# Patient Record
Sex: Male | Born: 1970
Health system: Southern US, Community
[De-identification: ages and names within clinical notes are randomized; demographics above are authoritative.]

## PROBLEM LIST (undated history)

## (undated) DIAGNOSIS — M7581 Other shoulder lesions, right shoulder: Secondary | ICD-10-CM

## (undated) DIAGNOSIS — M5412 Radiculopathy, cervical region: Secondary | ICD-10-CM

## (undated) DIAGNOSIS — N200 Calculus of kidney: Secondary | ICD-10-CM

## (undated) DIAGNOSIS — E669 Obesity, unspecified: Secondary | ICD-10-CM

## (undated) DIAGNOSIS — I700 Atherosclerosis of aorta: Secondary | ICD-10-CM

## (undated) DIAGNOSIS — N209 Urinary calculus, unspecified: Secondary | ICD-10-CM

## (undated) DIAGNOSIS — K4020 Bilateral inguinal hernia, without obstruction or gangrene, not specified as recurrent: Secondary | ICD-10-CM

## (undated) DIAGNOSIS — G43909 Migraine, unspecified, not intractable, without status migrainosus: Secondary | ICD-10-CM

## (undated) DIAGNOSIS — J343 Hypertrophy of nasal turbinates: Secondary | ICD-10-CM

## (undated) DIAGNOSIS — K219 Gastro-esophageal reflux disease without esophagitis: Secondary | ICD-10-CM

## (undated) DIAGNOSIS — E538 Deficiency of other specified B group vitamins: Secondary | ICD-10-CM

## (undated) DIAGNOSIS — N529 Male erectile dysfunction, unspecified: Secondary | ICD-10-CM

## (undated) DIAGNOSIS — K429 Umbilical hernia without obstruction or gangrene: Secondary | ICD-10-CM

## (undated) DIAGNOSIS — E559 Vitamin D deficiency, unspecified: Secondary | ICD-10-CM

## (undated) DIAGNOSIS — D120 Benign neoplasm of cecum: Secondary | ICD-10-CM

## (undated) HISTORY — DX: Calculus of kidney: N20.0

## (undated) HISTORY — DX: Gastro-esophageal reflux disease without esophagitis: K21.9

## (undated) HISTORY — PX: CERVICAL DISC ARTHROPLASTY: SHX587

## (undated) HISTORY — PX: URETEROSCOPY: SHX842

---

## 2010-05-24 HISTORY — PX: OTHER SURGICAL HISTORY: SHX169

## 2012-05-24 HISTORY — PX: NASAL SEPTUM SURGERY: SHX37

## 2014-05-24 HISTORY — PX: NASAL SINUS SURGERY: SHX719

## 2015-05-25 HISTORY — PX: LITHOTRIPSY: SUR834

## 2016-11-17 ENCOUNTER — Ambulatory Visit (INDEPENDENT_AMBULATORY_CARE_PROVIDER_SITE_OTHER): Payer: 59 | Admitting: Internal Medicine

## 2016-11-17 ENCOUNTER — Encounter: Payer: Self-pay | Admitting: Internal Medicine

## 2016-11-17 VITALS — BP 122/82 | HR 69 | Temp 98.2°F | Resp 15 | Ht 72.5 in | Wt 251.6 lb

## 2016-11-17 DIAGNOSIS — R059 Cough, unspecified: Secondary | ICD-10-CM

## 2016-11-17 DIAGNOSIS — M25511 Pain in right shoulder: Secondary | ICD-10-CM

## 2016-11-17 DIAGNOSIS — J329 Chronic sinusitis, unspecified: Secondary | ICD-10-CM

## 2016-11-17 DIAGNOSIS — E669 Obesity, unspecified: Secondary | ICD-10-CM

## 2016-11-17 DIAGNOSIS — R05 Cough: Secondary | ICD-10-CM | POA: Diagnosis not present

## 2016-11-17 DIAGNOSIS — Z79899 Other long term (current) drug therapy: Secondary | ICD-10-CM

## 2016-11-17 DIAGNOSIS — K2100 Gastro-esophageal reflux disease with esophagitis, without bleeding: Secondary | ICD-10-CM

## 2016-11-17 DIAGNOSIS — R0981 Nasal congestion: Secondary | ICD-10-CM

## 2016-11-17 DIAGNOSIS — G8929 Other chronic pain: Secondary | ICD-10-CM | POA: Diagnosis not present

## 2016-11-17 DIAGNOSIS — E538 Deficiency of other specified B group vitamins: Secondary | ICD-10-CM | POA: Diagnosis not present

## 2016-11-17 DIAGNOSIS — E559 Vitamin D deficiency, unspecified: Secondary | ICD-10-CM

## 2016-11-17 DIAGNOSIS — R5383 Other fatigue: Secondary | ICD-10-CM | POA: Diagnosis not present

## 2016-11-17 MED ORDER — MONTELUKAST SODIUM 10 MG PO TABS
10.0000 mg | ORAL_TABLET | Freq: Every day | ORAL | 3 refills | Status: DC
Start: 2016-11-17 — End: 2017-03-11

## 2016-11-17 NOTE — Patient Instructions (Addendum)
Try using fish oil and turmeric for your joint pains.    I have made  a referral to [facility] to resolve your sinus issues, and I have ordered Pulmonary Function Tests to be done in [place]   I appreciate your concern about continuing your prilosec (a PPI) long term  in light of the recently published studies suggesting an association with increased risk of dementia and kidney failure. The theory is that they block absorption of important vitamins and minerals and cause deficiencies that lead to other health problems.    I advise you to try switching  From your PPI to either famotidine 20 mg once or twice daily,  or to  ranitidine 150 mg once or twice daily.  These medications are  H2 blockers and are available without a prescription.    if your reflux symptoms are controlled,  You can Continue the daily h2 blocker until they are ot and then resume the Prilosec until symptoms are resolved.  Marland Kitchen

## 2016-11-17 NOTE — Progress Notes (Signed)
Subjective:  Patient ID: Seth Stone, male    DOB: July 25, 1970  Age: 46 y.o. MRN: [id number]  CC: The primary encounter diagnosis was Sinus congestion. Diagnoses of Obesity (BMI 30.0-34.9), Fatigue, unspecified type, Cough, Long-term use of high-risk medication, Vitamin D deficiency, B12 deficiency, Chronic right shoulder pain, Chronic congestion of paranasal sinus, and Gastroesophageal reflux disease with esophagitis were also pertinent to this visit.  HPI Seth Stone presents for establishment of care, referred by Seth and Seth Stone.    Cc;  Rightshoulder and right elbow pain , not severe,  Has been working out with Architectural technologist.    2) sinus congestion.  Had Sinus surgery in 2016 at [facility]  Symptoms resolved transiently but returned .  Had allergy testing , but now thinks certain foods trigger it including beer . Had a lot of allergy symptom this spring , tried zyrtec and flonase. flonase helped more . Has had some nosebleeds.  3) Cough/pulmonary :  Has OSA  By one study ,  Second study was negative but done right after sinus surgery, before he stated snoring again.  Owns CPAP but does not use it currently.   Doesn't tolerate it,  woke up gasping every night  Sleeps on side  Currently has restorative sleep,  No daytime somnolence and normal blood pressure.     Has had PFTs ,wants to have them REPEATED given his chronic cough    Diagnosed  With GERD and hiatal hernia   Has been taking daily prilosec for 13 years.   History of Kidney stones    History of Low Vitamin D   Lunch at 1:00 today burger without bun .  History Seth has a past medical history of GERD (gastroesophageal reflux disease).   He has a past surgical history that includes hemorrhiordectomy (2012); Lithotripsy (2017); Nasal septum surgery (2014); and Nasal sinus surgery (2016).   His family history includes Alcohol abuse in his father; Arthritis in his father; Colon cancer in his maternal grandmother;  Diabetes in his mother; Emphysema in his sister; Hypertension in his father; Stroke in his maternal grandmother.He reports that he has never smoked. He has never used smokeless tobacco. He reports that he drinks alcohol. He reports that he does not use drugs.  No outpatient prescriptions prior to visit.   No facility-administered medications prior to visit.     Review of Systems:  Patient denies headache, fevers, malaise, unintentional weight loss, skin rash, eye pain, sinus congestion and sinus pain, sore throat, dysphagia,  hemoptysis , cough, dyspnea, wheezing, chest pain, palpitations, orthopnea, edema, abdominal pain, nausea, melena, diarrhea, constipation, flank pain, dysuria, hematuria, urinary  Frequency, nocturia, numbness, tingling, seizures,  Focal weakness, Loss of consciousness,  Tremor, insomnia, depression, anxiety, and suicidal ideation.     Objective:   BP 122/82 (BP Location: Left Arm, Patient Position: Sitting, Cuff Size: Large)   Pulse 69   Temp 98.2 F (36.8 C) (Oral)   Resp 15   Ht 6' 0.5" (1.842 m)   Wt 251 lb 9.6 oz (114.1 kg)   SpO2 96%   BMI 33.65 kg/m   Physical Exam:  General appearance: alert, cooperative and appears stated age Ears: normal TM's and external ear canals both ears Throat: lips, mucosa, and tongue normal; teeth and gums normal Neck: no adenopathy, no carotid bruit, supple, symmetrical, trachea midline and thyroid not enlarged, symmetric, no tenderness/mass/nodules Back: symmetric, no curvature. ROM normal. No CVA tenderness. Lungs: clear to auscultation bilaterally Heart: regular rate  and rhythm, S1, S2 normal, no murmur, click, rub or gallop Abdomen: soft, non-tender; bowel sounds normal; no masses,  no organomegaly Pulses: 2+ and symmetric Skin: Skin color, texture, turgor normal. No rashes or lesions Lymph nodes: Cervical, supraclavicular, and axillary nodes normal.   Assessment & Plan:   Problem List Items Addressed This Visit      Vitamin D deficiency   Relevant Orders   VITAMIN D 25 Hydroxy (Vit-D Deficiency, Fractures) (Completed)   Obesity (BMI 30.0-34.9)    I have addressed  BMI and recommended wt loss of 10% of body weigh over the next 6 months using a low glycemic index diet and regular exercise a minimum of 5 days per week.        Relevant Orders   Lipid panel (Completed)   Gastroesophageal reflux disease with esophagitis    Has beenon chronci acid suppression for 13 years with no prior EGD.  The risks of long term PPI use for acid suppression in patients without documented Barretts esophagus were discussed, including the possibility of osteoporosis, iron , B12 and magnesium deficiencies,  CKD, and dementia. Suggested trial of pepcid 20 mg daily and if GERD symptoms return,  To resume daily PPI and  referral for EGD.        Cough    Chronic,  Unclear how much is GERD  and sinus  related vs asthma.  PFTs ordered       Relevant Orders   Pulmonary Function Test [facility] Only   Chronic right shoulder pain    TENDONITIS SUSPECTED gein his workouts,  He wants to avoid  Use of  Nsaids.  ICe, turmeric and fish ol advised.       Chronic congestion of paranasal sinus    He had transient imporvement after sinus surgery done remotely. H as had recurrent nosebleeds.  Referral to [facility]       B12 deficiency    Likely due to chronic use of prilosec.  Will start sublingual supplementation until IM can be obtained       Relevant Orders   Vitamin B12 (Completed)    Other Visit Diagnoses    Sinus congestion    -  Primary   Relevant Orders   Ambulatory referral to ENT   Fatigue, unspecified type       Relevant Orders   Comprehensive metabolic panel (Completed)   TSH (Completed)   CBC with Differential/Platelet (Completed)   Long-term use of high-risk medication         A total of 45 minutes was spent with patient more than half of which was spent in counseling patient on the above mentioned issues ,  reviewing and explaining  labs and imaging studies ordered, and coordination of care.  I am having Mr. Stone start on montelukast, Cyanocobalamin, and ergocalciferol. I am also having him maintain his omeprazole.  Meds ordered this encounter  Medications  . omeprazole (PRILOSEC OTC) 20 MG tablet    Sig: Take 20 mg by mouth daily.  . montelukast (SINGULAIR) 10 MG tablet    Sig: Take 1 tablet (10 mg total) by mouth at bedtime.    Dispense:  30 tablet    Refill:  3  . Cyanocobalamin 1000 MCG SUBL    Sig: Place 1 tablet (1,000 mcg total) under the tongue daily.    Dispense:  90 tablet    Refill:  0  . ergocalciferol (DRISDOL) 50000 units capsule    Sig: Take 1 capsule (50,000 Units  total) by mouth once a week.    Dispense:  4 capsule    Refill:  2    There are no discontinued medications.  Follow-up: No Follow-up on file.   Crecencio Mc, MD

## 2016-11-18 LAB — COMPREHENSIVE METABOLIC PANEL
ALT: 32 U/L (ref 0–53)
AST: 25 U/L (ref 0–37)
Albumin: 4.4 g/dL (ref 3.5–5.2)
Alkaline Phosphatase: 91 U/L (ref 39–117)
BILIRUBIN TOTAL: 1.5 mg/dL — AB (ref 0.2–1.2)
BUN: 15 mg/dL (ref 6–23)
CALCIUM: 9.7 mg/dL (ref 8.4–10.5)
CHLORIDE: 106 meq/L (ref 96–112)
CO2: 27 meq/L (ref 19–32)
Creatinine, Ser: 1.19 mg/dL (ref 0.40–1.50)
GFR: 70.01 mL/min (ref >60.00)
Glucose, Bld: 100 mg/dL — ABNORMAL HIGH (ref 70–99)
POTASSIUM: 4.1 meq/L (ref 3.5–5.1)
Sodium: 143 mEq/L (ref 135–145)
Total Protein: 7.2 g/dL (ref 6.0–8.3)

## 2016-11-18 LAB — VITAMIN D 25 HYDROXY (VIT D DEFICIENCY, FRACTURES): VITD: 16.46 ng/mL — ABNORMAL LOW (ref 30.00–100.00)

## 2016-11-18 LAB — CBC WITH DIFFERENTIAL/PLATELET
BASOS PCT: 0.5 % (ref 0.0–3.0)
Basophils Absolute: 0 10*3/uL (ref 0.0–0.1)
EOS ABS: 0.1 10*3/uL (ref 0.0–0.7)
Eosinophils Relative: 1.4 % (ref 0.0–5.0)
HEMATOCRIT: 48.6 % (ref 39.0–52.0)
HEMOGLOBIN: 16.4 g/dL (ref 13.0–17.0)
LYMPHS PCT: 36.9 % (ref 12.0–46.0)
Lymphs Abs: 2.2 10*3/uL (ref 0.7–4.0)
MCHC: 33.8 g/dL (ref 30.0–36.0)
MCV: 91.1 fl (ref 78.0–100.0)
MONOS PCT: 10.4 % (ref 3.0–12.0)
Monocytes Absolute: 0.6 10*3/uL (ref 0.1–1.0)
Neutro Abs: 3 10*3/uL (ref 1.4–7.7)
Neutrophils Relative %: 50.8 % (ref 43.0–77.0)
Platelets: 204 10*3/uL (ref 150.0–400.0)
RBC: 5.34 Mil/uL (ref 4.22–5.81)
RDW: 13.3 % (ref 11.5–15.5)
WBC: 5.9 10*3/uL (ref 4.0–10.5)

## 2016-11-18 LAB — LIPID PANEL
CHOL/HDL RATIO: 4
Cholesterol: 178 mg/dL (ref 0–200)
HDL: 43.8 mg/dL (ref >39.00)
LDL CALC: 108 mg/dL — AB (ref 0–99)
NonHDL: 134.02
TRIGLYCERIDES: 129 mg/dL (ref 0.0–149.0)
VLDL: 25.8 mg/dL (ref 0.0–40.0)

## 2016-11-18 LAB — TSH: TSH: 1.45 u[IU]/mL (ref 0.35–4.50)

## 2016-11-18 LAB — VITAMIN B12: VITAMIN B 12: 189 pg/mL — AB (ref 211–911)

## 2016-11-19 ENCOUNTER — Encounter: Payer: Self-pay | Admitting: Internal Medicine

## 2016-11-19 DIAGNOSIS — R05 Cough: Secondary | ICD-10-CM | POA: Insufficient documentation

## 2016-11-19 DIAGNOSIS — K219 Gastro-esophageal reflux disease without esophagitis: Secondary | ICD-10-CM | POA: Insufficient documentation

## 2016-11-19 DIAGNOSIS — K2100 Gastro-esophageal reflux disease with esophagitis, without bleeding: Secondary | ICD-10-CM | POA: Insufficient documentation

## 2016-11-19 DIAGNOSIS — E559 Vitamin D deficiency, unspecified: Secondary | ICD-10-CM | POA: Insufficient documentation

## 2016-11-19 DIAGNOSIS — M25511 Pain in right shoulder: Secondary | ICD-10-CM

## 2016-11-19 DIAGNOSIS — E669 Obesity, unspecified: Secondary | ICD-10-CM | POA: Insufficient documentation

## 2016-11-19 DIAGNOSIS — R059 Cough, unspecified: Secondary | ICD-10-CM | POA: Insufficient documentation

## 2016-11-19 DIAGNOSIS — G8929 Other chronic pain: Secondary | ICD-10-CM | POA: Insufficient documentation

## 2016-11-19 DIAGNOSIS — J329 Chronic sinusitis, unspecified: Secondary | ICD-10-CM | POA: Insufficient documentation

## 2016-11-19 DIAGNOSIS — E538 Deficiency of other specified B group vitamins: Secondary | ICD-10-CM | POA: Insufficient documentation

## 2016-11-19 MED ORDER — ERGOCALCIFEROL 1.25 MG (50000 UT) PO CAPS: 50000.0000 [IU] | ORAL_CAPSULE | ORAL | 2 refills | Status: DC

## 2016-11-19 MED ORDER — CYANOCOBALAMIN 1000 MCG SL SUBL: 1.0000 | SUBLINGUAL_TABLET | Freq: Every day | SUBLINGUAL | 0 refills | Status: DC

## 2016-11-19 NOTE — Assessment & Plan Note (Signed)
He had transient imporvement after sinus surgery done remotely. H as had recurrent nosebleeds.  Referral to [facility]

## 2016-11-19 NOTE — Assessment & Plan Note (Signed)
TENDONITIS SUSPECTED gein his workouts,  He wants to avoid  Use of  Nsaids.  ICe, turmeric and fish ol advised.

## 2016-11-19 NOTE — Assessment & Plan Note (Signed)
I have addressed  BMI and recommended wt loss of 10% of body weigh over the next 6 months using a low glycemic index diet and regular exercise a minimum of 5 days per week.   

## 2016-11-19 NOTE — Assessment & Plan Note (Signed)
Has beenon chronci acid suppression for 13 years with no prior EGD.  The risks of long term PPI use for acid suppression in patients without documented Barretts esophagus were discussed, including the possibility of osteoporosis, iron , B12 and magnesium deficiencies,  CKD, and dementia. Suggested trial of pepcid 20 mg daily and if GERD symptoms return,  To resume daily PPI and  referral for EGD.

## 2016-11-19 NOTE — Assessment & Plan Note (Signed)
Likely due to chronic use of prilosec.  Will start sublingual supplementation until IM can be obtained

## 2016-11-19 NOTE — Assessment & Plan Note (Addendum)
Chronic,  Unclear how much is GERD  and sinus  related vs asthma.  PFTs ordered

## 2016-11-22 ENCOUNTER — Telehealth: Payer: Self-pay

## 2016-11-22 DIAGNOSIS — R059 Cough, unspecified: Secondary | ICD-10-CM

## 2016-11-22 DIAGNOSIS — R05 Cough: Secondary | ICD-10-CM

## 2016-11-22 NOTE — Telephone Encounter (Signed)
-----   Message from Eustace Pen sent at 11/22/2016 11:00 AM EDT ----- Regarding: PFT Graylon Good, Can you please enter an additional PFT for this pt? Can you put in the comment that they need the methacholine challenge please? Thanks! Melissa

## 2016-12-07 ENCOUNTER — Ambulatory Visit: Payer: 59 | Attending: Internal Medicine

## 2016-12-07 DIAGNOSIS — R059 Cough, unspecified: Secondary | ICD-10-CM

## 2016-12-07 DIAGNOSIS — R05 Cough: Secondary | ICD-10-CM | POA: Insufficient documentation

## 2016-12-09 ENCOUNTER — Ambulatory Visit: Payer: 59 | Attending: Internal Medicine

## 2016-12-09 ENCOUNTER — Encounter: Payer: Self-pay | Admitting: Internal Medicine

## 2016-12-09 DIAGNOSIS — R059 Cough, unspecified: Secondary | ICD-10-CM

## 2016-12-09 DIAGNOSIS — R05 Cough: Secondary | ICD-10-CM | POA: Insufficient documentation

## 2016-12-09 MED ORDER — SODIUM CHLORIDE 0.9 % IN NEBU
3.0000 mL | INHALATION_SOLUTION | Freq: Once | RESPIRATORY_TRACT | Status: AC
  Administered 2016-12-09: 3 mL via RESPIRATORY_TRACT

## 2016-12-09 MED ORDER — ALBUTEROL SULFATE (2.5 MG/3ML) 0.083% IN NEBU
2.5000 mg | INHALATION_SOLUTION | Freq: Once | RESPIRATORY_TRACT | Status: DC
  Filled 2016-12-09: qty 3

## 2016-12-09 MED ORDER — METHACHOLINE 1 MG/ML NEB SOLN
2.0000 mL | Freq: Once | RESPIRATORY_TRACT | Status: AC
  Administered 2016-12-09: 2 mg via RESPIRATORY_TRACT
  Filled 2016-12-09: qty 2

## 2016-12-09 MED ORDER — METHACHOLINE 16 MG/ML NEB SOLN
2.0000 mL | Freq: Once | RESPIRATORY_TRACT | Status: AC
Start: 2016-12-09 — End: 2016-12-09
  Administered 2016-12-09: 32 mg via RESPIRATORY_TRACT
  Filled 2016-12-09: qty 2

## 2016-12-09 MED ORDER — ALBUTEROL SULFATE (2.5 MG/3ML) 0.083% IN NEBU
2.5000 mg | INHALATION_SOLUTION | Freq: Once | RESPIRATORY_TRACT | Status: AC
  Administered 2016-12-09: 2.5 mg via RESPIRATORY_TRACT
  Filled 2016-12-09: qty 3

## 2016-12-09 MED ORDER — METHACHOLINE 0.0625 MG/ML NEB SOLN
2.0000 mL | Freq: Once | RESPIRATORY_TRACT | Status: AC
  Administered 2016-12-09: 0.125 mg via RESPIRATORY_TRACT
  Filled 2016-12-09: qty 2

## 2016-12-09 MED ORDER — METHACHOLINE 4 MG/ML NEB SOLN
2.0000 mL | Freq: Once | RESPIRATORY_TRACT | Status: AC
  Administered 2016-12-09: 8 mg via RESPIRATORY_TRACT
  Filled 2016-12-09: qty 2

## 2016-12-09 MED ORDER — METHACHOLINE 0.25 MG/ML NEB SOLN
2.0000 mL | Freq: Once | RESPIRATORY_TRACT | Status: AC
  Administered 2016-12-09: 0.5 mg via RESPIRATORY_TRACT
  Filled 2016-12-09: qty 2

## 2016-12-09 NOTE — Assessment & Plan Note (Signed)
PFTS were normal  Mortimer Fries, July 2018)

## 2017-02-08 ENCOUNTER — Other Ambulatory Visit: Payer: Self-pay | Admitting: Internal Medicine

## 2017-02-15 ENCOUNTER — Other Ambulatory Visit: Payer: Self-pay | Admitting: Internal Medicine

## 2017-03-11 ENCOUNTER — Other Ambulatory Visit: Payer: Self-pay | Admitting: Internal Medicine

## 2017-03-17 DIAGNOSIS — J310 Chronic rhinitis: Secondary | ICD-10-CM | POA: Diagnosis not present

## 2017-03-17 DIAGNOSIS — R0981 Nasal congestion: Secondary | ICD-10-CM | POA: Diagnosis not present

## 2017-04-21 DIAGNOSIS — J343 Hypertrophy of nasal turbinates: Secondary | ICD-10-CM | POA: Diagnosis not present

## 2017-04-21 DIAGNOSIS — J310 Chronic rhinitis: Secondary | ICD-10-CM | POA: Diagnosis not present

## 2017-04-26 DIAGNOSIS — J343 Hypertrophy of nasal turbinates: Secondary | ICD-10-CM | POA: Insufficient documentation

## 2017-05-13 DIAGNOSIS — J343 Hypertrophy of nasal turbinates: Secondary | ICD-10-CM | POA: Diagnosis not present

## 2017-05-13 DIAGNOSIS — J020 Streptococcal pharyngitis: Secondary | ICD-10-CM | POA: Diagnosis not present

## 2017-05-13 DIAGNOSIS — J3489 Other specified disorders of nose and nasal sinuses: Secondary | ICD-10-CM | POA: Diagnosis not present

## 2017-05-19 DIAGNOSIS — J310 Chronic rhinitis: Secondary | ICD-10-CM | POA: Diagnosis not present

## 2017-06-05 ENCOUNTER — Other Ambulatory Visit: Payer: Self-pay | Admitting: Internal Medicine

## 2017-06-07 ENCOUNTER — Encounter: Payer: Self-pay | Admitting: *Deleted

## 2017-06-10 ENCOUNTER — Other Ambulatory Visit: Payer: Self-pay | Admitting: *Deleted

## 2017-06-10 NOTE — Telephone Encounter (Signed)
Updated Vit D dose on med list

## 2017-06-23 DIAGNOSIS — J343 Hypertrophy of nasal turbinates: Secondary | ICD-10-CM | POA: Diagnosis not present

## 2017-06-23 DIAGNOSIS — J310 Chronic rhinitis: Secondary | ICD-10-CM | POA: Diagnosis not present

## 2017-07-01 ENCOUNTER — Telehealth: Payer: Self-pay

## 2017-07-01 NOTE — Telephone Encounter (Signed)
Copied from [place] [id number]. Topic: Quick Communication - See Telephone Encounter >> Jul 01, 2017  3:17 PM Oneta Rack wrote: CRM for notification. See Telephone encounter for:   07/01/17.   Relation to SF:SELT Call back number:[phone]   Reason for call:  Patient would like he's BP check, requesting appointment with he's PCP only or nurse,  please advise >> Jul 01, 2017  3:23 PM Oneta Rack wrote: CRM for notification. See Telephone encounter for:   07/01/17.   Relation to RV:UYEB Call back number:[phone]   Reason for call:  Patient would like he's BP check, requesting appointment with he's PCP only or nurse,  please advise

## 2017-07-01 NOTE — Telephone Encounter (Signed)
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  DOT requires to follow up with PCP .  He has been monitoring BP at home . Blood pressure reading at home 117/78, 133/92-39  Would like to schedule office visit to come in to see Dr Derrel Nip for blood pressure.

## 2017-07-01 NOTE — Telephone Encounter (Signed)
At recent DOT physical when BP was elevated at 150/100 patient was sick with bronchitis taking prednisone and ABX and OTC meds . BP's at home after stopping prednisone 117/78 to 133/92 scheduled patient for an appointment on 07/11/17

## 2017-07-11 ENCOUNTER — Encounter: Payer: Self-pay | Admitting: Internal Medicine

## 2017-07-11 ENCOUNTER — Ambulatory Visit (INDEPENDENT_AMBULATORY_CARE_PROVIDER_SITE_OTHER): Payer: 59 | Admitting: Internal Medicine

## 2017-07-11 VITALS — BP 122/80 | HR 72 | Temp 98.0°F | Resp 15 | Ht 72.5 in | Wt 249.8 lb

## 2017-07-11 DIAGNOSIS — R030 Elevated blood-pressure reading, without diagnosis of hypertension: Secondary | ICD-10-CM | POA: Diagnosis not present

## 2017-07-11 DIAGNOSIS — J329 Chronic sinusitis, unspecified: Secondary | ICD-10-CM

## 2017-07-11 NOTE — Assessment & Plan Note (Signed)
Resolved with inferior turbinate reduction done Dec 15 at [facility]

## 2017-07-11 NOTE — Progress Notes (Signed)
Subjective:  Patient ID: Seth Stone, male    DOB: 07-Apr-1971  Age: 47 y.o. MRN: [id number]  CC: The primary encounter diagnosis was Elevated blood pressure reading without diagnosis of hypertension. A diagnosis of Chronic congestion of paranasal sinus was also pertinent to this visit.  HPI Seth Stone presents for evaluation of blood pressure.  An elevated reading was apparently taken during his recent  DOT exam, and the reading was apparently  140/90.  The reading was done  on a day he didn't feel good. The reading was also taken under unusual conditions (patient was told to bend over then stand up suddenly. ). He was not sitting quietly with his measured at the level of his heart/chest  He has no history of hypertension, but since then has been checking his readings at home with an automated commerical BP machine and states that his readings have been 130/80 or less.    He is exercising daily vigorously,  denies chest pain and shortness of breath with workouts. .  Not taking NSAIDs or oral decongestants  He has had sinus surgery in December at [facility]:  He underwent submucous resection of inferior turbinates  , and has had resolution of chronic congestion that was negatively affecting his sleep as well as his daytime activities,  He no longer snores . Outpatient Medications Prior to Visit  Medication Sig Dispense Refill  . Cholecalciferol (VITAMIN D3) 1000 units CAPS Take 1,000 Units by mouth daily.    . CVS VITAMIN B12 1000 MCG tablet TAKE 1 TABLET (1,000 MCG TOTAL) ONCE DAILY. (SUBLINGUAL NOT AVAILABLE) 90 tablet 0  . ranitidine (ZANTAC) 150 MG tablet Take 150 mg by mouth.    . montelukast (SINGULAIR) 10 MG tablet TAKE 1 TABLET BY MOUTH EVERYDAY AT BEDTIME (Patient not taking: Reported on 07/11/2017) 30 tablet 3  . omeprazole (PRILOSEC OTC) 20 MG tablet Take 20 mg by mouth daily.     Facility-Administered Medications Prior to Visit  Medication Dose Route Frequency Provider Last Rate Last Dose    . albuterol (PROVENTIL) (2.5 MG/3ML) 0.083% nebulizer solution 2.5 mg  2.5 mg Nebulization Once Laverle Hobby, MD        Review of Systems;  Patient denies headache, fevers, malaise, unintentional weight loss, skin rash, eye pain, sinus congestion and sinus pain, sore throat, dysphagia,  hemoptysis , cough, dyspnea, wheezing, chest pain, palpitations, orthopnea, edema, abdominal pain, nausea, melena, diarrhea, constipation, flank pain, dysuria, hematuria, urinary  Frequency, nocturia, numbness, tingling, seizures,  Focal weakness, Loss of consciousness,  Tremor, insomnia, depression, anxiety, and suicidal ideation.      Objective:  BP 122/80 (BP Location: Left Arm, Patient Position: Sitting, Cuff Size: Large)   Pulse 72   Temp 98 F (36.7 C) (Oral)   Resp 15   Ht 6' 0.5" (1.842 m)   Wt 249 lb 12.8 oz (113.3 kg)   SpO2 97%   BMI 33.41 kg/m   BP Readings from Last 3 Encounters:  07/11/17 122/80  11/17/16 122/82    Wt Readings from Last 3 Encounters:  07/11/17 249 lb 12.8 oz (113.3 kg)  11/17/16 251 lb 9.6 oz (114.1 kg)    General appearance: alert, cooperative and appears stated age Ears: normal TM's and external ear canals both ears Throat: lips, mucosa, and tongue normal; teeth and gums normal Neck: no adenopathy, no carotid bruit, supple, symmetrical, trachea midline and thyroid not enlarged, symmetric, no tenderness/mass/nodules Back: symmetric, no curvature. ROM normal. No CVA tenderness. Lungs: clear to auscultation  bilaterally Heart: regular rate and rhythm, S1, S2 normal, no murmur, click, rub or gallop Abdomen: soft, non-tender; bowel sounds normal; no masses,  no organomegaly Pulses: 2+ and symmetric Skin: Skin color, texture, turgor normal. No rashes or lesions Lymph nodes: Cervical, supraclavicular, and axillary nodes normal.  No results found for: HGBA1C  Lab Results  Component Value Date   CREATININE 1.19 11/17/2016    Lab Results  Component  Value Date   WBC 5.9 11/17/2016   HGB 16.4 11/17/2016   HCT 48.6 11/17/2016   PLT 204.0 11/17/2016   GLUCOSE 100 (H) 11/17/2016   CHOL 178 11/17/2016   TRIG 129.0 11/17/2016   HDL 43.80 11/17/2016   LDLCALC 108 (H) 11/17/2016   ALT 32 11/17/2016   AST 25 11/17/2016   NA 143 11/17/2016   K 4.1 11/17/2016   CL 106 11/17/2016   CREATININE 1.19 11/17/2016   BUN 15 11/17/2016   CO2 27 11/17/2016   TSH 1.45 11/17/2016    Patient was never admitted.  Assessment & Plan:   Problem List Items Addressed This Visit    Chronic congestion of paranasal sinus    Resolved with inferior turbinate reduction done Dec 15 at [facility]       Elevated blood pressure reading without diagnosis of hypertension - Primary    He has no evidence of hypertension by multiple readings done today, 6 months ago,  and at home .  Will have patient return for an RN visit on  Wednesday with his home machine to verify that his home machine is accurate for a BP check with nurse.       Relevant Orders   Comprehensive metabolic panel   Lipid panel   Microalbumin / creatinine urine ratio      I have discontinued Seth Stone's omeprazole and montelukast. I am also having him maintain his CVS VITAMIN B12, Vitamin D3, and ranitidine.  No orders of the defined types were placed in this encounter.   Medications Discontinued During This Encounter  Medication Reason  . montelukast (SINGULAIR) 10 MG tablet Patient has not taken in last 30 days  . omeprazole (PRILOSEC OTC) 20 MG tablet Patient has not taken in last 30 days    Follow-up: No Follow-up on file.   Crecencio Mc, MD

## 2017-07-11 NOTE — Patient Instructions (Addendum)
Your blood pressure was 122/80 , which is below the DOT threshhold   Return on Wednesday and Thursday for your 2 other readings   You can have the labs done on one of those days

## 2017-07-11 NOTE — Assessment & Plan Note (Addendum)
He has no evidence of hypertension by multiple readings done today, 6 months ago,  and at home .  Will have patient return for an RN visit on  Wednesday with his home machine to verify that his home machine is accurate for a BP check with nurse.

## 2017-07-13 ENCOUNTER — Ambulatory Visit (INDEPENDENT_AMBULATORY_CARE_PROVIDER_SITE_OTHER): Payer: 59 | Admitting: *Deleted

## 2017-07-13 ENCOUNTER — Encounter: Payer: Self-pay | Admitting: *Deleted

## 2017-07-13 ENCOUNTER — Other Ambulatory Visit (INDEPENDENT_AMBULATORY_CARE_PROVIDER_SITE_OTHER): Payer: 59

## 2017-07-13 VITALS — BP 128/78 | HR 69 | Resp 18

## 2017-07-13 DIAGNOSIS — R030 Elevated blood-pressure reading, without diagnosis of hypertension: Secondary | ICD-10-CM

## 2017-07-13 LAB — LIPID PANEL
Cholesterol: 155 mg/dL (ref 0–200)
HDL: 42.8 mg/dL (ref >39.00)
LDL CALC: 98 mg/dL (ref 0–99)
NonHDL: 112.46
TRIGLYCERIDES: 73 mg/dL (ref 0.0–149.0)
Total CHOL/HDL Ratio: 4
VLDL: 14.6 mg/dL (ref 0.0–40.0)

## 2017-07-13 LAB — COMPREHENSIVE METABOLIC PANEL WITH GFR
ALT: 21 U/L (ref 0–53)
AST: 20 U/L (ref 0–37)
Albumin: 4 g/dL (ref 3.5–5.2)
Alkaline Phosphatase: 68 U/L (ref 39–117)
BUN: 13 mg/dL (ref 6–23)
CO2: 30 meq/L (ref 19–32)
Calcium: 9.5 mg/dL (ref 8.4–10.5)
Chloride: 105 meq/L (ref 96–112)
Creatinine, Ser: 1.01 mg/dL (ref 0.40–1.50)
GFR: 84.36 mL/min
Glucose, Bld: 100 mg/dL — ABNORMAL HIGH (ref 70–99)
Potassium: 4.3 meq/L (ref 3.5–5.1)
Sodium: 140 meq/L (ref 135–145)
Total Bilirubin: 1.7 mg/dL — ABNORMAL HIGH (ref 0.2–1.2)
Total Protein: 7.3 g/dL (ref 6.0–8.3)

## 2017-07-13 LAB — MICROALBUMIN / CREATININE URINE RATIO
Creatinine,U: 250.5 mg/dL
Microalb Creat Ratio: 0.4 mg/g (ref 0.0–30.0)
Microalb, Ur: 0.9 mg/dL (ref 0.0–1.9)

## 2017-07-13 NOTE — Progress Notes (Signed)
Patient was advised on 07/11/17 to bring home cuff in for comparison to nurse BP. BP taken in left arm 128/78 pulse 69 few minutes later patient took reading with home cuff in left arm reading 138/82 pulse 72 advised patient the readings are very close and similar but would send to PCP .

## 2017-07-14 ENCOUNTER — Ambulatory Visit (INDEPENDENT_AMBULATORY_CARE_PROVIDER_SITE_OTHER): Payer: 59 | Admitting: *Deleted

## 2017-07-14 VITALS — BP 124/78 | HR 74 | Resp 20

## 2017-07-14 DIAGNOSIS — R030 Elevated blood-pressure reading, without diagnosis of hypertension: Secondary | ICD-10-CM

## 2017-07-14 NOTE — Progress Notes (Signed)
  I have reviewed the above information and agree with above.    , MD 

## 2017-07-14 NOTE — Progress Notes (Signed)
Patient in office for BP check per OV note, BP left arm 124/78 pulse 74

## 2017-07-18 NOTE — Progress Notes (Signed)
  I have reviewed the above information and agree with above.    , MD 

## 2017-12-26 ENCOUNTER — Encounter: Payer: Self-pay | Admitting: Internal Medicine

## 2017-12-27 ENCOUNTER — Ambulatory Visit: Payer: 59 | Admitting: Family Medicine

## 2017-12-27 ENCOUNTER — Encounter: Payer: Self-pay | Admitting: Family Medicine

## 2017-12-27 VITALS — BP 138/80 | HR 71 | Temp 98.1°F | Wt 238.2 lb

## 2017-12-27 DIAGNOSIS — M549 Dorsalgia, unspecified: Secondary | ICD-10-CM

## 2017-12-27 DIAGNOSIS — M542 Cervicalgia: Secondary | ICD-10-CM

## 2017-12-27 DIAGNOSIS — M25521 Pain in right elbow: Secondary | ICD-10-CM | POA: Diagnosis not present

## 2017-12-27 DIAGNOSIS — M25511 Pain in right shoulder: Secondary | ICD-10-CM

## 2017-12-27 MED ORDER — MELOXICAM 7.5 MG PO TABS: 7.5000 mg | ORAL_TABLET | Freq: Every day | ORAL | 0 refills | Status: DC

## 2017-12-27 MED ORDER — KETOROLAC TROMETHAMINE 60 MG/2ML IM SOLN
60.0000 mg | Freq: Once | INTRAMUSCULAR | Status: AC
  Administered 2017-12-27: 60 mg via INTRAMUSCULAR

## 2017-12-27 NOTE — Patient Instructions (Signed)
Great to meet you!  Take 1st dose of meloxicam tomorrow AM with food and we will have you follow up in 2 weeks  Be sure to stretch before working out and you can continue to use your inversion table/get massage if this helps your pain.

## 2017-12-27 NOTE — Progress Notes (Signed)
Subjective:    Patient ID: Seth Stone, male    DOB: 01/16/1971, 47 y.o.   MRN: [id number]  HPI Presents to clinic c/o right shoulder, right  Elbow, upper back and neck pain for about 1 month.  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  He wonders if he possibly overdid it while working out.  He tried various doses of ibuprofen and it ranging from 600 mg to 1000 mg at one time without much relief in pain.  States he did take an Excedrin and noted that did mildly help the pain.  Also states he has got an massages and has been using inversion table with some relief in pain, but pain in his neck, shoulder and right elbow will return.  Denies any fall or obvious injury to shoulder elbow or neck.  Notices to his right elbow pain especially when he is driving and will rest his right arm on the center console.   Patient Active Problem List   Diagnosis Date Noted  . Elevated blood pressure reading without diagnosis of hypertension 07/11/2017  . B12 deficiency 11/19/2016  . Vitamin D deficiency 11/19/2016  . Chronic right shoulder pain 11/19/2016  . Cough 11/19/2016  . Obesity (BMI 30.0-34.9) 11/19/2016  . Chronic congestion of paranasal sinus 11/19/2016  . Gastroesophageal reflux disease with esophagitis 11/19/2016    Social History   Tobacco Use  . Smoking status: Never Smoker  . Smokeless tobacco: Never Used  Substance Use Topics  . Alcohol use: Yes   Review of Systems  Constitutional: Negative for activity change, chills, fatigue and fever.  HENT: Negative.   Eyes: Negative.   Respiratory: Negative for cough, shortness of breath and wheezing.   Cardiovascular: Negative for chest pain, palpitations and leg swelling.  Gastrointestinal: Negative.   Genitourinary: Negative.   Musculoskeletal: Positive for arthralgias and myalgias.       +pain in right shoulder, elbow, neck.   Neurological: Negative for dizziness, weakness and headaches.        Objective:   Physical Exam  Constitutional: He is oriented to person, place, and time. He appears well-developed and well-nourished. No distress.  HENT:  Head: Normocephalic and atraumatic.  Eyes: EOM are normal. No scleral icterus.  Neck: Normal range of motion. Neck supple. No JVD present. No tracheal deviation present.  Cardiovascular: Normal rate and regular rhythm.  Pulmonary/Chest: Effort normal. No respiratory distress.  Musculoskeletal: Normal range of motion. He exhibits tenderness. He exhibits no edema or deformity.  Tenderness with palpation of the right elbow and right side of cervical spine.  Tenderness across the right trapezius muscle & right deltoid muscle.  Active Range of Motion of the neck, right shoulder, right elbow, right wrist/fingers is intact.   Neurological: He is alert and oriented to person, place, and time. No cranial nerve deficit. Coordination normal.  Skin: Skin is warm and dry. Capillary refill takes less than 2 seconds. He is not diaphoretic. No pallor.  Psychiatric: He has a normal mood and affect. His behavior is normal.  Nursing note and vitals reviewed.   Blood pressure 138/80, pulse 71, temperature 98.1 F (36.7 C), temperature source Oral, weight 238 lb 3.2 oz (108 kg), SpO2 98 %.     Assessment & Plan:    Neck pain, right shoulder pain, right elbow pain, right sided upper back pain-   I suspect this pain to be musculoskeletal in nature.  Range of motion is intact, and I  believe patient would benefit from anti-inflammatory medication.    60mg  toradol IM x1 in clinic today.   Patient will begin taking 7.5 mg of meloxicam once daily. He will follow-up in 2 weeks for evaluation of progress.  If pain continues, we  options of possible imaging.  Patient will continue to work out and he has been encouraged to be sure to stretch muscles before beginning exercise routine.  Also was advised he may continue to use his inversion table and get massages if  this helps improve pain.

## 2017-12-29 DIAGNOSIS — M542 Cervicalgia: Secondary | ICD-10-CM | POA: Diagnosis not present

## 2017-12-29 DIAGNOSIS — M9901 Segmental and somatic dysfunction of cervical region: Secondary | ICD-10-CM | POA: Diagnosis not present

## 2017-12-29 DIAGNOSIS — M531 Cervicobrachial syndrome: Secondary | ICD-10-CM | POA: Diagnosis not present

## 2018-01-02 DIAGNOSIS — M542 Cervicalgia: Secondary | ICD-10-CM | POA: Diagnosis not present

## 2018-01-02 DIAGNOSIS — M9901 Segmental and somatic dysfunction of cervical region: Secondary | ICD-10-CM | POA: Diagnosis not present

## 2018-01-02 DIAGNOSIS — M531 Cervicobrachial syndrome: Secondary | ICD-10-CM | POA: Diagnosis not present

## 2018-01-04 DIAGNOSIS — M531 Cervicobrachial syndrome: Secondary | ICD-10-CM | POA: Diagnosis not present

## 2018-01-04 DIAGNOSIS — M542 Cervicalgia: Secondary | ICD-10-CM | POA: Diagnosis not present

## 2018-01-04 DIAGNOSIS — M9901 Segmental and somatic dysfunction of cervical region: Secondary | ICD-10-CM | POA: Diagnosis not present

## 2018-01-06 DIAGNOSIS — M9901 Segmental and somatic dysfunction of cervical region: Secondary | ICD-10-CM | POA: Diagnosis not present

## 2018-01-06 DIAGNOSIS — M542 Cervicalgia: Secondary | ICD-10-CM | POA: Diagnosis not present

## 2018-01-06 DIAGNOSIS — M531 Cervicobrachial syndrome: Secondary | ICD-10-CM | POA: Diagnosis not present

## 2018-01-09 DIAGNOSIS — M542 Cervicalgia: Secondary | ICD-10-CM | POA: Diagnosis not present

## 2018-01-09 DIAGNOSIS — M9901 Segmental and somatic dysfunction of cervical region: Secondary | ICD-10-CM | POA: Diagnosis not present

## 2018-01-09 DIAGNOSIS — M531 Cervicobrachial syndrome: Secondary | ICD-10-CM | POA: Diagnosis not present

## 2018-01-11 ENCOUNTER — Ambulatory Visit (INDEPENDENT_AMBULATORY_CARE_PROVIDER_SITE_OTHER): Payer: 59

## 2018-01-11 ENCOUNTER — Ambulatory Visit: Payer: 59 | Admitting: Internal Medicine

## 2018-01-11 ENCOUNTER — Encounter: Payer: Self-pay | Admitting: Internal Medicine

## 2018-01-11 VITALS — BP 136/84 | HR 65 | Temp 98.2°F | Resp 15 | Ht 72.5 in | Wt 240.8 lb

## 2018-01-11 DIAGNOSIS — M542 Cervicalgia: Secondary | ICD-10-CM

## 2018-01-11 DIAGNOSIS — M9901 Segmental and somatic dysfunction of cervical region: Secondary | ICD-10-CM | POA: Diagnosis not present

## 2018-01-11 DIAGNOSIS — M531 Cervicobrachial syndrome: Secondary | ICD-10-CM | POA: Diagnosis not present

## 2018-01-11 MED ORDER — TIZANIDINE HCL 2 MG PO TABS: 2.0000 mg | ORAL_TABLET | Freq: Four times a day (QID) | ORAL | 4 refills | Status: DC | PRN

## 2018-01-11 MED ORDER — MELOXICAM 15 MG PO TABS: 15.0000 mg | ORAL_TABLET | Freq: Every day | ORAL | 5 refills | Status: DC

## 2018-01-11 NOTE — Patient Instructions (Addendum)
Continue daily meloxicam    Ok to increase dose to 15 mg daily   Start taking the omeprazole daily to protect stomach    You can add up to 2000 mg of acetominophen (tylenol) every day safely  In divided doses (500 mg every 6 hours  Or 1000 mg every 12 hours.)   Plain films of your cervical spine today  To look at alignment,  lood for signs of degenerative changes

## 2018-01-11 NOTE — Progress Notes (Signed)
Subjective:  Patient ID: Seth Stone, male    DOB: [birth year]  Age: 47 y.o. MRN: [id number]  CC: The primary encounter diagnosis was Neck pain on right side. A diagnosis of Neck pain, musculoskeletal was also pertinent to this visit.  HPI Seth Stone presents for evaluation of  persistent neck pain that radiates to his right arm . The pain has been Present for over  6 weeks and was not the result of any fall or unusual activity.  Patient does work out regularly using "[employer]" but denies any precedent workout that resulted in immediate pain  That may have caused it.  H states that he just  woke up one day with neck pain .  He was treated 2 weeks ago for same with an IM steroid injection and rx for daily use of meloxicam 7.5 mg .  He states that there has been no change in his pain.  The pain is aggravated by sleeping on his side and by raising his arms to drive, which causes pain to radiate to his shoulder blade when driving car     Outpatient Medications Prior to Visit  Medication Sig Dispense Refill  . Cholecalciferol (VITAMIN D3) 1000 units CAPS Take 1,000 Units by mouth daily.    . [facility]S VITAMIN B12 1000 MCG tablet TAKE 1 TABLET (1,000 MCG TOTAL) ONCE DAILY. (SUBLINGUAL NOT AVAILABLE) 90 tablet 0  . omeprazole (PRILOSEC OTC) 20 MG tablet Take by mouth.    . meloxicam (MOBIC) 7.5 MG tablet Take 1 tablet (7.5 mg total) by mouth daily. 30 tablet 0  . ibuprofen (ADVIL,MOTRIN) 200 MG tablet Take 200 mg by mouth every 6 (six) hours as needed.     Facility-Administered Medications Prior to Visit  Medication Dose Route Frequency Provider Last Rate Last Dose  . albuterol (PROVENTIL) (2.5 MG/3ML) 0.083% nebulizer solution 2.5 mg  2.5 mg Nebulization Once Laverle Hobby, MD        Review of Systems;  Patient denies headache, fevers, malaise, unintentional weight loss, skin rash, eye pain, sinus congestion and sinus pain, sore throat, dysphagia,  hemoptysis , cough, dyspnea, wheezing, chest  pain, palpitations, orthopnea, edema, abdominal pain, nausea, melena, diarrhea, constipation, flank pain, dysuria, hematuria, urinary  Frequency, nocturia, numbness, tingling, seizures,  Focal weakness, Loss of consciousness,  Tremor, insomnia, depression, anxiety, and suicidal ideation.      Objective:  BP 136/84 (BP Location: Left Arm, Patient Position: Sitting, Cuff Size: Normal)   Pulse 65   Temp 98.2 F (36.8 C) (Oral)   Resp 15   Ht 6' 0.5" (1.842 m)   Wt 240 lb 12.8 oz (109.2 kg)   SpO2 96%   BMI 32.21 kg/m   BP Readings from Last 3 Encounters:  01/11/18 136/84  12/27/17 138/80  07/14/17 124/78    Wt Readings from Last 3 Encounters:  01/11/18 240 lb 12.8 oz (109.2 kg)  12/27/17 238 lb 3.2 oz (108 kg)  07/11/17 249 lb 12.8 oz (113.3 kg)    General appearance: alert, cooperative and appears stated age Ears: normal TM's and external ear canals both ears Throat: lips, mucosa, and tongue normal; teeth and gums normal Neck: no adenopathy, no carotid bruit, supple, symmetrical, trachea midline and thyroid not enlarged, symmetric, no spinal  tenderness/mass/nodules Back: symmetric, no curvature. ROM normal. No CVA tenderness. Lungs: clear to auscultation bilaterally Heart: regular rate and rhythm, S1, S2 normal, no murmur, click, rub or gallop Neuro: CNs 2-12 intact. DTRs 2+/4 in biceps, brachioradialis, patellars and  achilles. Muscle strength 5/5 in upper and lower exremities. Fine resting tremor bilaterally both hands cerebellar function normal. Romberg negative.  No pronator drift.   Gait normal.   No results found for: HGBA1C  Lab Results  Component Value Date   CREATININE 1.01 07/13/2017   CREATININE 1.19 11/17/2016    Lab Results  Component Value Date   WBC 5.9 11/17/2016   HGB 16.4 11/17/2016   HCT 48.6 11/17/2016   PLT 204.0 11/17/2016   GLUCOSE 100 (H) 07/13/2017   CHOL 155 07/13/2017   TRIG 73.0 07/13/2017   HDL 42.80 07/13/2017   LDLCALC 98 07/13/2017     ALT 21 07/13/2017   AST 20 07/13/2017   NA 140 07/13/2017   K 4.3 07/13/2017   CL 105 07/13/2017   CREATININE 1.01 07/13/2017   BUN 13 07/13/2017   CO2 30 07/13/2017   TSH 1.45 11/17/2016   MICROALBUR 0.9 07/13/2017    Patient was never admitted.  Assessment & Plan:   Problem List Items Addressed This Visit    Neck pain on right side - Primary    Subacute, radiating to right arm.  Review of chart notes chronic right shoulder pain in 2018 attributed to workouts.  Neuro exam is normal and cervical spine films are normal today.  increases meloxicam to 15 mg daily,  add MR tizanidine, gi prophylaxis with   PPI , and tylenol.   Rest 2 weeks from workouts.  If no change will need MRI cervical spine        Relevant Orders   DG Cervical Spine Complete (Completed)    Other Visit Diagnoses    Neck pain, musculoskeletal       Relevant Medications   meloxicam (MOBIC) 15 MG tablet      I have discontinued [name]'s ibuprofen. I have also changed his meloxicam. Additionally, I am having him start on tiZANidine. Lastly, I am having him maintain his CVS VITAMIN B12, Vitamin D3, and omeprazole.  Meds ordered this encounter  Medications  . tiZANidine (ZANAFLEX) 2 MG tablet    Sig: Take 1 tablet (2 mg total) by mouth every 6 (six) hours as needed for muscle spasms.    Dispense:  30 tablet    Refill:  4  . meloxicam (MOBIC) 15 MG tablet    Sig: Take 1 tablet (15 mg total) by mouth daily.    Dispense:  30 tablet    Refill:  5    Medications Discontinued During This Encounter  Medication Reason  . ibuprofen (ADVIL,MOTRIN) 200 MG tablet Patient has not taken in last 30 days  . meloxicam (MOBIC) 7.5 MG tablet Reorder    Follow-up: No follow-ups on file.   Crecencio Mc, MD

## 2018-01-13 DIAGNOSIS — M542 Cervicalgia: Secondary | ICD-10-CM | POA: Diagnosis not present

## 2018-01-13 DIAGNOSIS — M531 Cervicobrachial syndrome: Secondary | ICD-10-CM | POA: Diagnosis not present

## 2018-01-13 DIAGNOSIS — M9901 Segmental and somatic dysfunction of cervical region: Secondary | ICD-10-CM | POA: Diagnosis not present

## 2018-01-14 DIAGNOSIS — M542 Cervicalgia: Secondary | ICD-10-CM | POA: Insufficient documentation

## 2018-01-14 NOTE — Assessment & Plan Note (Signed)
Subacute, radiating to right arm.  Review of chart notes chronic right shoulder pain in 2018 attributed to workouts.  Neuro exam is normal and cervical spine films are normal today.  increases meloxicam to 15 mg daily,  add MR tizanidine, gi prophylaxis with   PPI , and tylenol.   Rest 2 weeks from workouts.  If no change will need MRI cervical spine

## 2018-01-20 DIAGNOSIS — M531 Cervicobrachial syndrome: Secondary | ICD-10-CM | POA: Diagnosis not present

## 2018-01-20 DIAGNOSIS — M542 Cervicalgia: Secondary | ICD-10-CM | POA: Diagnosis not present

## 2018-01-20 DIAGNOSIS — M9901 Segmental and somatic dysfunction of cervical region: Secondary | ICD-10-CM | POA: Diagnosis not present

## 2018-01-24 DIAGNOSIS — M531 Cervicobrachial syndrome: Secondary | ICD-10-CM | POA: Diagnosis not present

## 2018-01-24 DIAGNOSIS — M542 Cervicalgia: Secondary | ICD-10-CM | POA: Diagnosis not present

## 2018-01-24 DIAGNOSIS — M9901 Segmental and somatic dysfunction of cervical region: Secondary | ICD-10-CM | POA: Diagnosis not present

## 2018-01-25 DIAGNOSIS — M531 Cervicobrachial syndrome: Secondary | ICD-10-CM | POA: Diagnosis not present

## 2018-01-25 DIAGNOSIS — M542 Cervicalgia: Secondary | ICD-10-CM | POA: Diagnosis not present

## 2018-01-25 DIAGNOSIS — M9901 Segmental and somatic dysfunction of cervical region: Secondary | ICD-10-CM | POA: Diagnosis not present

## 2018-01-26 ENCOUNTER — Telehealth: Payer: Self-pay | Admitting: Internal Medicine

## 2018-01-26 DIAGNOSIS — G8929 Other chronic pain: Secondary | ICD-10-CM

## 2018-01-26 DIAGNOSIS — M25511 Pain in right shoulder: Principal | ICD-10-CM

## 2018-01-26 NOTE — Telephone Encounter (Signed)
Copied from [place] [phone]. Topic: Quick Communication - See Telephone Encounter >> Jan 26, 2018  3:42 PM Rutherford Nail, NT wrote: CRM for notification. See Telephone encounter for: 01/26/18. Patient calling and states that his right shoulder pain has not improved. States that he was told to call back if pain did not get better to possibly get in to have an MRI. CB#: [phone]

## 2018-01-26 NOTE — Telephone Encounter (Signed)
Request for MRI

## 2018-01-27 ENCOUNTER — Encounter: Payer: Self-pay | Admitting: *Deleted

## 2018-01-27 NOTE — Telephone Encounter (Signed)
Sent mychart message

## 2018-01-27 NOTE — Telephone Encounter (Signed)
Referral is in process as requested for MRI right shoulder.

## 2018-01-27 NOTE — Telephone Encounter (Signed)
Pt has called and stated that his shoulder pain has not gotten any better and was told to call back to let you know so tht the MRI can be ordered.

## 2018-01-30 DIAGNOSIS — M531 Cervicobrachial syndrome: Secondary | ICD-10-CM | POA: Diagnosis not present

## 2018-01-30 DIAGNOSIS — M9901 Segmental and somatic dysfunction of cervical region: Secondary | ICD-10-CM | POA: Diagnosis not present

## 2018-01-30 DIAGNOSIS — M542 Cervicalgia: Secondary | ICD-10-CM | POA: Diagnosis not present

## 2018-02-01 DIAGNOSIS — M531 Cervicobrachial syndrome: Secondary | ICD-10-CM | POA: Diagnosis not present

## 2018-02-01 DIAGNOSIS — M9901 Segmental and somatic dysfunction of cervical region: Secondary | ICD-10-CM | POA: Diagnosis not present

## 2018-02-01 DIAGNOSIS — M542 Cervicalgia: Secondary | ICD-10-CM | POA: Diagnosis not present

## 2018-02-09 ENCOUNTER — Telehealth: Payer: Self-pay | Admitting: Internal Medicine

## 2018-02-09 DIAGNOSIS — R109 Unspecified abdominal pain: Secondary | ICD-10-CM

## 2018-02-09 NOTE — Telephone Encounter (Signed)
Request for CT scan

## 2018-02-09 NOTE — Telephone Encounter (Signed)
Copied from [place] [phone]. Topic: Quick Communication - See Telephone Encounter >> Feb 09, 2018  5:09 PM Blase Mess A wrote: CRM for notification. See Telephone encounter for: 02/09/18.Patient is requesting a scan of his kidney show that he does not have any more kidney stones. He is wanting to know if he can have an order placed for a scan of his kidneys while he is have the MRI.  Please advise.  Patients call back number [phone]

## 2018-02-10 NOTE — Telephone Encounter (Signed)
No, I will not.  We have never discussed kidney stones,  And unless he is having symptoms of persistent stones I do not recommend  Additional exposure to radiation .

## 2018-02-13 NOTE — Telephone Encounter (Signed)
I have ordered the CT that he has requested

## 2018-02-13 NOTE — Telephone Encounter (Signed)
Recommendations for CT

## 2018-02-13 NOTE — Telephone Encounter (Signed)
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 The pt is okay with doing something else for the proof if there is anything else. Pt stated that the form and proof is due in November.

## 2018-02-13 NOTE — Telephone Encounter (Signed)
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 Pt states that forms are due in November and would like to know if are any other options than CT showing that he does not have kidney stones, if Dr. Derrel Nip will not approve CT. Pt is requesting a call back to discuss.

## 2018-02-14 NOTE — Telephone Encounter (Signed)
Attempted to call pt. No answer, no voicemail. Need to let pt know that Dr. Derrel Nip has ordered the CT scan that he requested.  PEC may speak with pt.

## 2018-02-14 NOTE — Telephone Encounter (Signed)
Patient is aware of the CT, he will be at the appointment on 9/27

## 2018-02-15 ENCOUNTER — Ambulatory Visit
Admission: RE | Admit: 2018-02-15 | Discharge: 2018-02-15 | Disposition: A | Discharge status: Home / Self Care | Payer: 59 | Source: Ambulatory Visit | Attending: Internal Medicine | Admitting: Internal Medicine

## 2018-02-15 DIAGNOSIS — M25511 Pain in right shoulder: Secondary | ICD-10-CM | POA: Insufficient documentation

## 2018-02-15 DIAGNOSIS — G8929 Other chronic pain: Secondary | ICD-10-CM | POA: Insufficient documentation

## 2018-02-15 DIAGNOSIS — M12811 Other specific arthropathies, not elsewhere classified, right shoulder: Secondary | ICD-10-CM | POA: Insufficient documentation

## 2018-02-15 DIAGNOSIS — M19011 Primary osteoarthritis, right shoulder: Secondary | ICD-10-CM | POA: Diagnosis not present

## 2018-02-17 ENCOUNTER — Ambulatory Visit
Admission: RE | Admit: 2018-02-17 | Discharge: 2018-02-17 | Disposition: A | Discharge status: Home / Self Care | Payer: 59 | Source: Ambulatory Visit | Attending: Internal Medicine | Admitting: Internal Medicine

## 2018-02-17 DIAGNOSIS — N420 Calculus of prostate: Secondary | ICD-10-CM | POA: Diagnosis not present

## 2018-02-17 DIAGNOSIS — K439 Ventral hernia without obstruction or gangrene: Secondary | ICD-10-CM | POA: Diagnosis not present

## 2018-02-17 DIAGNOSIS — I700 Atherosclerosis of aorta: Secondary | ICD-10-CM | POA: Diagnosis not present

## 2018-02-17 DIAGNOSIS — R109 Unspecified abdominal pain: Secondary | ICD-10-CM | POA: Insufficient documentation

## 2018-02-17 DIAGNOSIS — I708 Atherosclerosis of other arteries: Secondary | ICD-10-CM | POA: Diagnosis not present

## 2018-02-19 ENCOUNTER — Other Ambulatory Visit: Payer: Self-pay | Admitting: Internal Medicine

## 2018-02-19 DIAGNOSIS — I700 Atherosclerosis of aorta: Secondary | ICD-10-CM | POA: Insufficient documentation

## 2018-02-24 ENCOUNTER — Other Ambulatory Visit: Payer: Self-pay | Admitting: Internal Medicine

## 2018-02-24 DIAGNOSIS — G8929 Other chronic pain: Secondary | ICD-10-CM

## 2018-02-24 DIAGNOSIS — M25511 Pain in right shoulder: Principal | ICD-10-CM

## 2018-02-24 MED ORDER — TRAMADOL HCL 50 MG PO TABS: 50.0000 mg | ORAL_TABLET | Freq: Three times a day (TID) | ORAL | 0 refills | Status: DC | PRN

## 2018-03-08 ENCOUNTER — Other Ambulatory Visit: Payer: Self-pay | Admitting: Internal Medicine

## 2018-03-08 NOTE — Telephone Encounter (Signed)
Refilled: 02/24/2018 Last OV: 01/11/2018 Next OV: not scheduled

## 2018-03-08 NOTE — Telephone Encounter (Signed)
Requested medication (s) are due for refill today:   No  Filled 02/24/18 however he is requesting a partial refill until sees the ortho doctor on Monday.  Requested medication (s) are on the active medication list:   Yes  Future visit scheduled:   No   Last ordered: 02/24/18  #30  0 refills   Requested Prescriptions  Pending Prescriptions Disp Refills   traMADol (ULTRAM) 50 MG tablet 30 tablet 0    Sig: Take 1 tablet (50 mg total) by mouth every 8 (eight) hours as needed.     Not Delegated - Analgesics:  Opioid Agonists Failed - 03/08/2018 12:45 PM      Failed - This refill cannot be delegated      Failed - Urine Drug Screen completed in last 360 days.      Passed - Valid encounter within last 6 months    Recent Outpatient Visits          1 month ago Neck pain on right side   [place], MD   2 months ago Neck pain, musculoskeletal    [place], FNP   8 months ago Elevated blood pressure reading without diagnosis of hypertension   [place] Crecencio Mc, MD   1 year ago Sinus congestion   [facility] Crecencio Mc, MD

## 2018-03-08 NOTE — Telephone Encounter (Signed)
Copied from [place] [phone]. Topic: Quick Communication - Rx Refill/Question >> Mar 08, 2018 12:40 PM Sheran Luz wrote: Medication: traMADol (ULTRAM) 50 MG tablet   Pt is requesting a partial refill of this medication-stating that he has an appointment with Ortho on Monday. Please advise.    Preferred Pharmacy (with phone number or street name): [id number] [place], [place] [phone] (Phone)

## 2018-03-09 MED ORDER — TRAMADOL HCL 50 MG PO TABS: 50.0000 mg | ORAL_TABLET | Freq: Two times a day (BID) | ORAL | 1 refills | Status: DC

## 2018-03-09 NOTE — Telephone Encounter (Signed)
Refilled for #60 tablets.   

## 2018-03-13 ENCOUNTER — Other Ambulatory Visit: Payer: 59

## 2018-03-20 ENCOUNTER — Other Ambulatory Visit (INDEPENDENT_AMBULATORY_CARE_PROVIDER_SITE_OTHER): Payer: 59

## 2018-03-20 ENCOUNTER — Telehealth: Payer: Self-pay | Admitting: Internal Medicine

## 2018-03-20 DIAGNOSIS — I700 Atherosclerosis of aorta: Secondary | ICD-10-CM

## 2018-03-20 LAB — COMPREHENSIVE METABOLIC PANEL
ALT: 36 U/L (ref 0–53)
AST: 24 U/L (ref 0–37)
Albumin: 4.2 g/dL (ref 3.5–5.2)
Alkaline Phosphatase: 67 U/L (ref 39–117)
BUN: 13 mg/dL (ref 6–23)
CALCIUM: 9.7 mg/dL (ref 8.4–10.5)
CO2: 28 meq/L (ref 19–32)
CREATININE: 1.03 mg/dL (ref 0.40–1.50)
Chloride: 103 mEq/L (ref 96–112)
GFR: 82.23 mL/min (ref >60.00)
Glucose, Bld: 95 mg/dL (ref 70–99)
Potassium: 4.2 mEq/L (ref 3.5–5.1)
Sodium: 139 mEq/L (ref 135–145)
TOTAL PROTEIN: 7 g/dL (ref 6.0–8.3)
Total Bilirubin: 1.3 mg/dL — ABNORMAL HIGH (ref 0.2–1.2)

## 2018-03-20 LAB — LIPID PANEL
Cholesterol: 154 mg/dL (ref 0–200)
HDL: 47.5 mg/dL (ref >39.00)
LDL CALC: 75 mg/dL (ref 0–99)
NonHDL: 106.68
Total CHOL/HDL Ratio: 3
Triglycerides: 158 mg/dL — ABNORMAL HIGH (ref 0.0–149.0)
VLDL: 31.6 mg/dL (ref 0.0–40.0)

## 2018-03-20 NOTE — Telephone Encounter (Signed)
Pt would like a copy of the CT scan printed out and would like to pick up. Please advise? Thank you!  Call pt @ [phone].

## 2018-03-21 NOTE — Telephone Encounter (Signed)
Printed imaging results  for patient and notified patient ready for pick up.

## 2018-03-22 ENCOUNTER — Other Ambulatory Visit: Payer: 59

## 2018-03-29 ENCOUNTER — Other Ambulatory Visit: Payer: Self-pay | Admitting: Surgery

## 2018-03-29 DIAGNOSIS — M7581 Other shoulder lesions, right shoulder: Secondary | ICD-10-CM | POA: Insufficient documentation

## 2018-03-29 DIAGNOSIS — M5412 Radiculopathy, cervical region: Secondary | ICD-10-CM | POA: Insufficient documentation

## 2018-04-03 ENCOUNTER — Ambulatory Visit
Admission: RE | Admit: 2018-04-03 | Discharge: 2018-04-03 | Disposition: A | Discharge status: Home / Self Care | Payer: 59 | Source: Ambulatory Visit | Attending: Surgery | Admitting: Surgery

## 2018-04-03 DIAGNOSIS — M2578 Osteophyte, vertebrae: Secondary | ICD-10-CM | POA: Insufficient documentation

## 2018-04-03 DIAGNOSIS — M8938 Hypertrophy of bone, other site: Secondary | ICD-10-CM | POA: Insufficient documentation

## 2018-04-03 DIAGNOSIS — M4802 Spinal stenosis, cervical region: Secondary | ICD-10-CM | POA: Insufficient documentation

## 2018-04-03 DIAGNOSIS — M50222 Other cervical disc displacement at C5-C6 level: Secondary | ICD-10-CM | POA: Insufficient documentation

## 2018-04-03 DIAGNOSIS — M542 Cervicalgia: Secondary | ICD-10-CM | POA: Diagnosis not present

## 2018-04-03 DIAGNOSIS — M5412 Radiculopathy, cervical region: Secondary | ICD-10-CM | POA: Diagnosis not present

## 2018-04-10 ENCOUNTER — Other Ambulatory Visit: Payer: Self-pay | Admitting: Internal Medicine

## 2018-04-11 NOTE — Telephone Encounter (Signed)
Refilled: 03/09/2018 Last OV: 01/11/2018 Next OV: not refilled

## 2018-04-12 DIAGNOSIS — M50123 Cervical disc disorder at C6-C7 level with radiculopathy: Secondary | ICD-10-CM | POA: Insufficient documentation

## 2018-04-12 DIAGNOSIS — M7581 Other shoulder lesions, right shoulder: Secondary | ICD-10-CM | POA: Diagnosis not present

## 2018-04-17 DIAGNOSIS — M5412 Radiculopathy, cervical region: Secondary | ICD-10-CM | POA: Diagnosis not present

## 2018-04-17 DIAGNOSIS — M5020 Other cervical disc displacement, unspecified cervical region: Secondary | ICD-10-CM | POA: Diagnosis not present

## 2018-04-24 DIAGNOSIS — M5412 Radiculopathy, cervical region: Secondary | ICD-10-CM | POA: Diagnosis not present

## 2018-04-24 DIAGNOSIS — M6281 Muscle weakness (generalized): Secondary | ICD-10-CM | POA: Diagnosis not present

## 2018-04-26 DIAGNOSIS — M6281 Muscle weakness (generalized): Secondary | ICD-10-CM | POA: Diagnosis not present

## 2018-04-26 DIAGNOSIS — M5412 Radiculopathy, cervical region: Secondary | ICD-10-CM | POA: Diagnosis not present

## 2018-04-27 DIAGNOSIS — M6281 Muscle weakness (generalized): Secondary | ICD-10-CM | POA: Diagnosis not present

## 2018-04-27 DIAGNOSIS — M5412 Radiculopathy, cervical region: Secondary | ICD-10-CM | POA: Diagnosis not present

## 2018-05-02 DIAGNOSIS — M6281 Muscle weakness (generalized): Secondary | ICD-10-CM | POA: Diagnosis not present

## 2018-05-02 DIAGNOSIS — M5412 Radiculopathy, cervical region: Secondary | ICD-10-CM | POA: Diagnosis not present

## 2018-05-03 DIAGNOSIS — M5412 Radiculopathy, cervical region: Secondary | ICD-10-CM | POA: Diagnosis not present

## 2018-05-03 DIAGNOSIS — M6281 Muscle weakness (generalized): Secondary | ICD-10-CM | POA: Diagnosis not present

## 2018-05-05 DIAGNOSIS — M5412 Radiculopathy, cervical region: Secondary | ICD-10-CM | POA: Diagnosis not present

## 2018-05-05 DIAGNOSIS — M6281 Muscle weakness (generalized): Secondary | ICD-10-CM | POA: Diagnosis not present

## 2018-05-09 DIAGNOSIS — M6281 Muscle weakness (generalized): Secondary | ICD-10-CM | POA: Diagnosis not present

## 2018-05-09 DIAGNOSIS — M5412 Radiculopathy, cervical region: Secondary | ICD-10-CM | POA: Diagnosis not present

## 2018-05-11 DIAGNOSIS — M6281 Muscle weakness (generalized): Secondary | ICD-10-CM | POA: Diagnosis not present

## 2018-05-11 DIAGNOSIS — M5412 Radiculopathy, cervical region: Secondary | ICD-10-CM | POA: Diagnosis not present

## 2018-05-18 DIAGNOSIS — M5020 Other cervical disc displacement, unspecified cervical region: Secondary | ICD-10-CM | POA: Diagnosis not present

## 2018-05-18 DIAGNOSIS — M5412 Radiculopathy, cervical region: Secondary | ICD-10-CM | POA: Diagnosis not present

## 2018-05-25 DIAGNOSIS — M5412 Radiculopathy, cervical region: Secondary | ICD-10-CM | POA: Diagnosis not present

## 2018-05-25 DIAGNOSIS — M6281 Muscle weakness (generalized): Secondary | ICD-10-CM | POA: Diagnosis not present

## 2018-05-31 DIAGNOSIS — M50123 Cervical disc disorder at C6-C7 level with radiculopathy: Secondary | ICD-10-CM | POA: Diagnosis not present

## 2018-06-02 DIAGNOSIS — M5412 Radiculopathy, cervical region: Secondary | ICD-10-CM | POA: Diagnosis not present

## 2018-06-26 DIAGNOSIS — Z01818 Encounter for other preprocedural examination: Secondary | ICD-10-CM | POA: Diagnosis not present

## 2018-07-04 DIAGNOSIS — M5412 Radiculopathy, cervical region: Secondary | ICD-10-CM | POA: Diagnosis not present

## 2018-07-04 DIAGNOSIS — M50223 Other cervical disc displacement at C6-C7 level: Secondary | ICD-10-CM | POA: Diagnosis not present

## 2018-09-28 DIAGNOSIS — M5412 Radiculopathy, cervical region: Secondary | ICD-10-CM | POA: Diagnosis not present

## 2019-01-22 ENCOUNTER — Telehealth: Payer: Self-pay | Admitting: Internal Medicine

## 2019-01-23 ENCOUNTER — Other Ambulatory Visit: Payer: Self-pay

## 2019-01-23 DIAGNOSIS — Z20822 Contact with and (suspected) exposure to covid-19: Secondary | ICD-10-CM

## 2019-01-24 LAB — NOVEL CORONAVIRUS, NAA: SARS-CoV-2, NAA: NOT DETECTED

## 2019-02-22 IMAGING — MR MR SHOULDER*R* W/O CM
5 series · 37 of 40 positions shown · non-contrast
Comparison: None.

CLINICAL DATA: Neck pain radiating to the right arm over the last 2
months

EXAM:
MRI OF THE RIGHT SHOULDER WITHOUT CONTRAST
TECHNIQUE: Multiplanar, multisequence MR imaging of the shoulder was performed.
No intravenous contrast was administered.

[Series 4: PD fat-sat · axial · right · 4.0mm · 0.55mm/px · z∈[-168,-39]mm · 9 of 28 slices shown (1 of 2)]
[im 1/28]
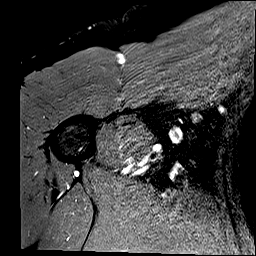
[im 4/28]
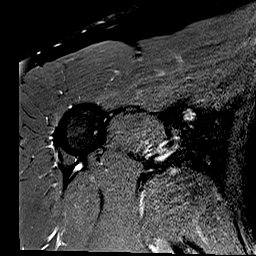
[im 7/28]
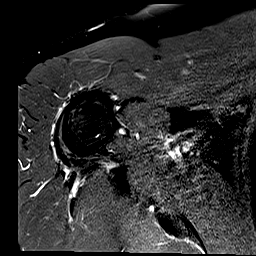
[im 11/28]
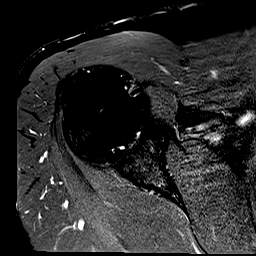
[im 14/28]
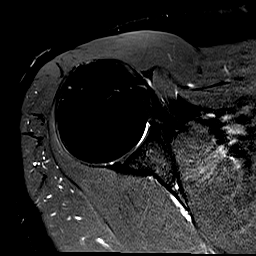
[im 17/28]
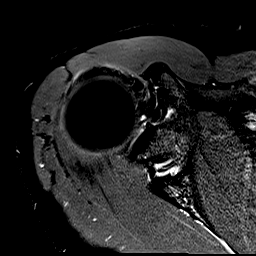
[im 21/28]
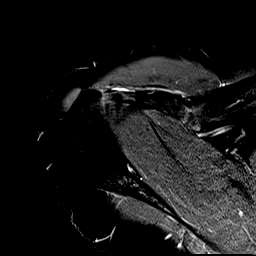
[im 24/28]
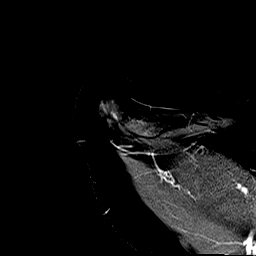
[im 28/28]
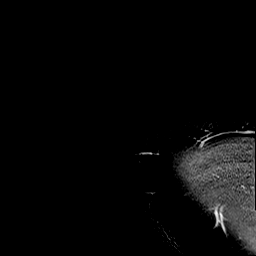

[Series 5: PD fat-sat · oblique · right · 4.0mm · 0.44mm/px · 8 of 26 slices shown (2 of 2)]
[im 1/26]
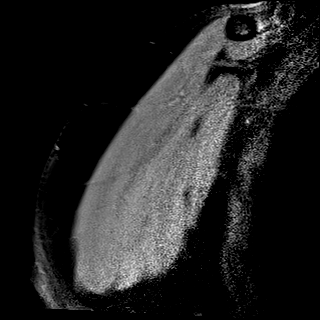
[im 4/26]
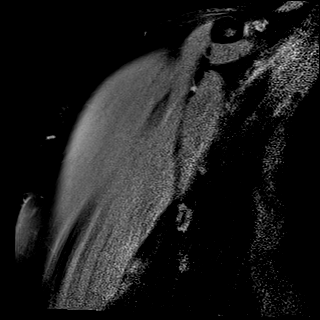
[im 8/26]
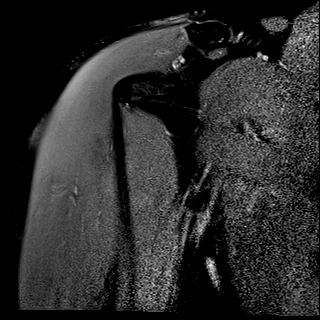
[im 11/26]
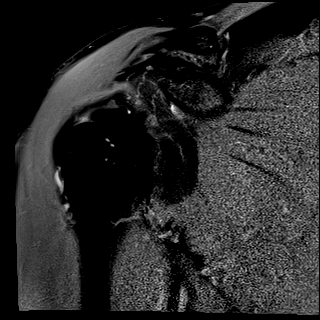
[im 15/26]
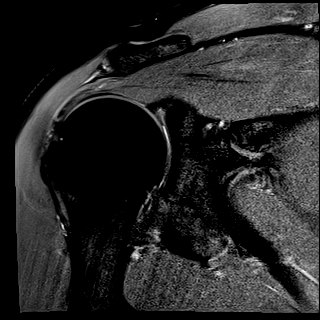
[im 18/26]
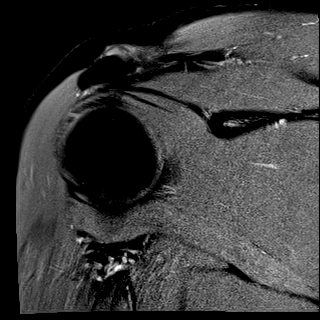
[im 22/26]
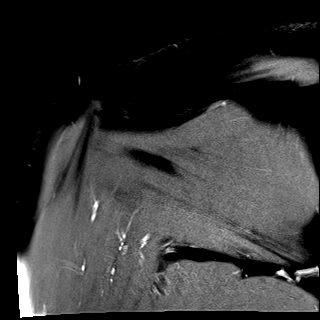
[im 26/26]
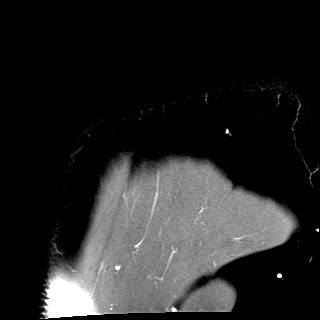

[Series 6: T2 fat-sat · oblique · right · 4.0mm · 0.44mm/px · 8 of 26 slices shown (1 of 2)]
[im 1/26]
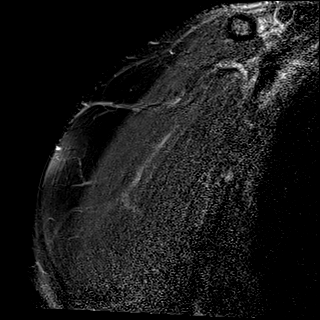
[im 4/26]
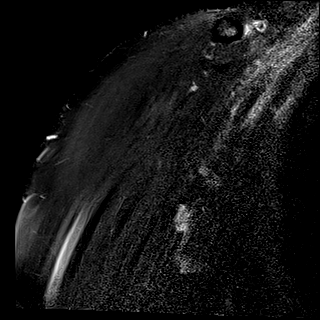
[im 8/26]
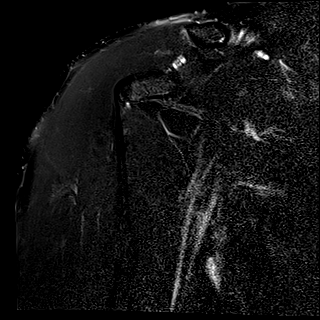
[im 11/26]
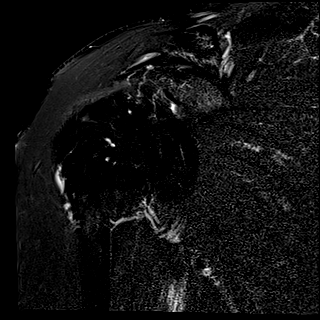
[im 15/26]
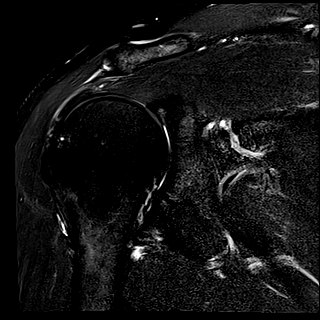
[im 18/26]
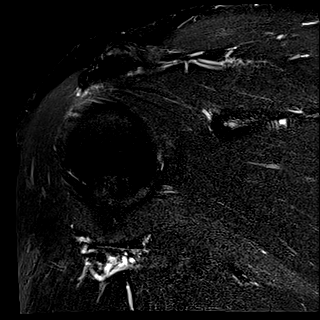
[im 22/26]
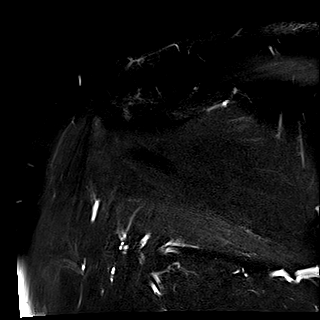
[im 26/26]
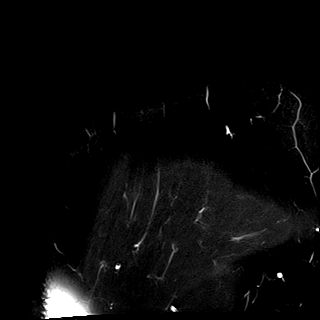

[Series 7: T2 fat-sat · oblique · right · 4.0mm · 0.27mm/px · 8 of 24 slices shown (2 of 2)]
[im 1/24]
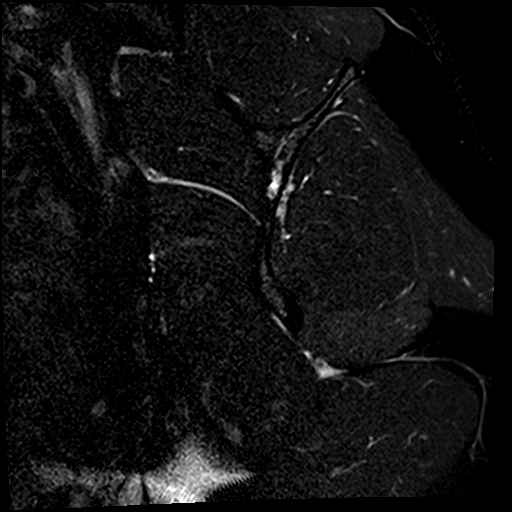
[im 4/24]
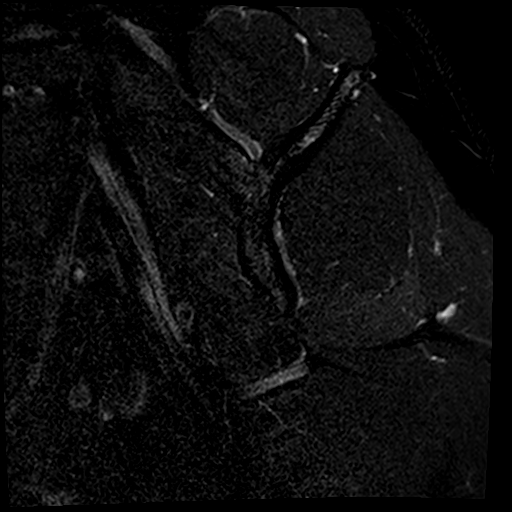
[im 7/24]
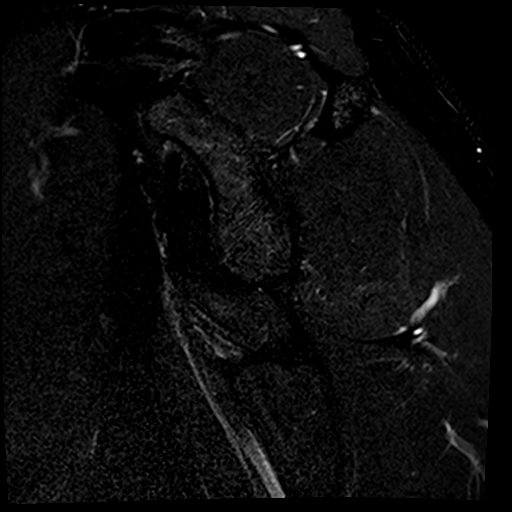
[im 10/24]
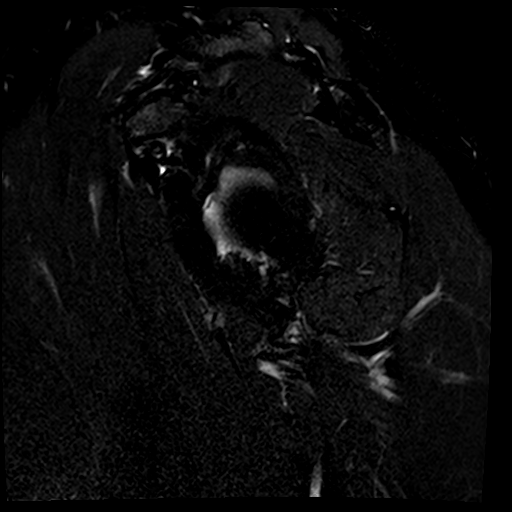
[im 14/24]
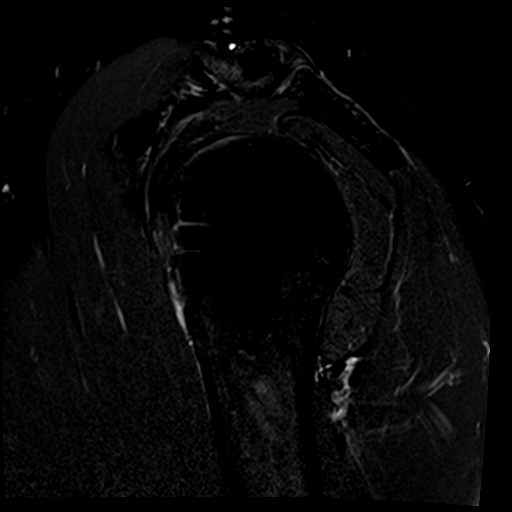
[im 17/24]
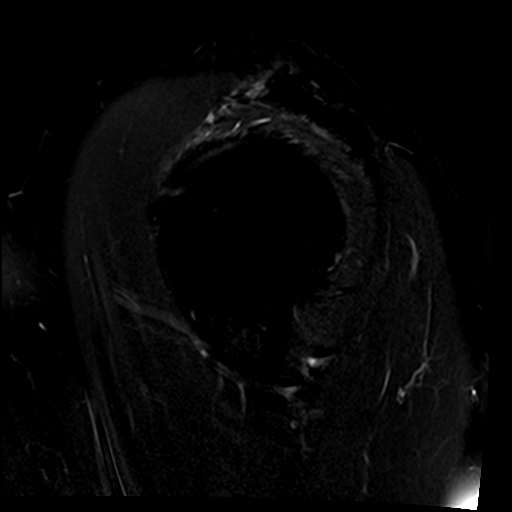
[im 20/24]
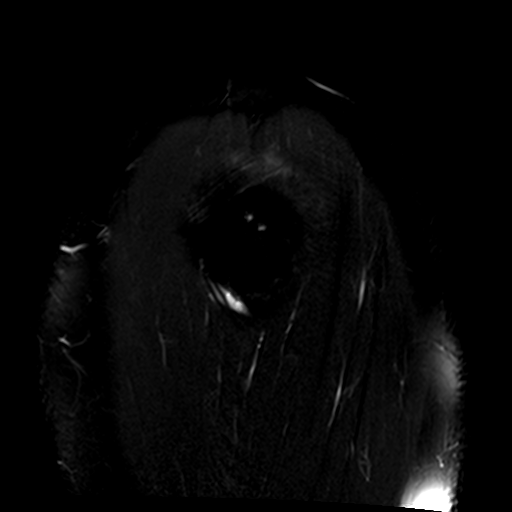
[im 24/24]
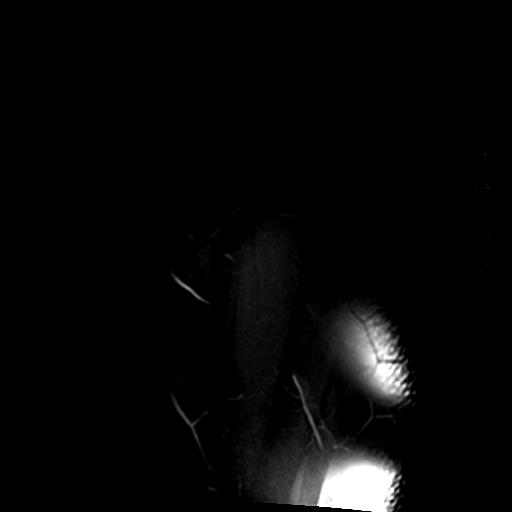

[Series 8: T1 · oblique · right · 4.0mm · 0.44mm/px · 4 of 22 slices shown]
[im 1/22]
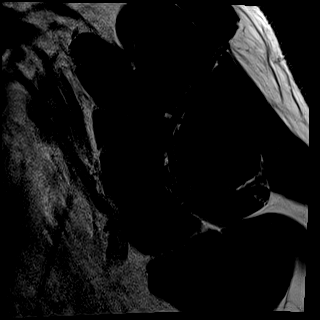
[im 4/22]
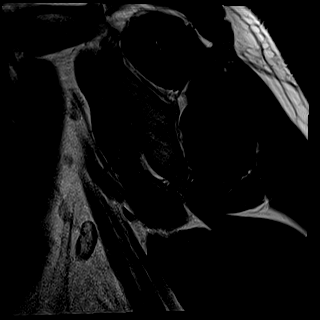
[im 8/22]
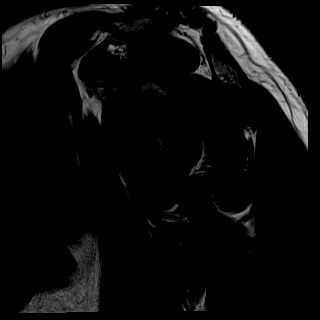
[im 11/22]
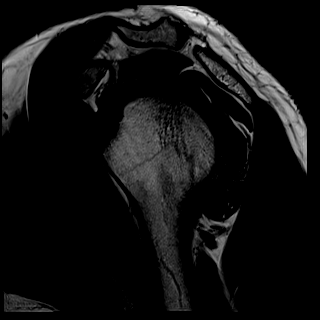

[37 of 40 positions shown; findings below may reference images not displayed]

FINDINGS: Rotator cuff:  Mild infraspinatus tendinopathy.

Muscles: Faintly accentuated T2 signal diffusely in the teres minor
muscle is shown on image [DATE]. No appreciable mass lesion along the
quadrilateral space.

Biceps long head:  Unremarkable

Acromioclavicular Joint: Mild spurring without significant
subcortical marrow edema. Type I acromion. No significant fluid in
the subacromial subdeltoid bursa.

Glenohumeral Joint: Preserved articular cartilage. No filling defect
or appreciable abnormality. Coracohumeral ligament unremarkable.

Labrum:  Unremarkable

Bones: No significant extra-articular osseous abnormalities
identified.

Other: No supplemental non-categorized findings.
IMPRESSION: 1. Mild infraspinatus tendinopathy.
2. Accentuated T2 signal in the teres minor muscle suggesting
quadrilateral space syndrome. No appreciable mass in the
quadrilateral space is identified.
3. Minimal degenerative AC joint arthropathy.

## 2019-03-06 ENCOUNTER — Other Ambulatory Visit: Payer: Self-pay

## 2019-03-06 DIAGNOSIS — Z20822 Contact with and (suspected) exposure to covid-19: Secondary | ICD-10-CM

## 2019-03-08 LAB — NOVEL CORONAVIRUS, NAA: SARS-CoV-2, NAA: NOT DETECTED

## 2019-04-02 ENCOUNTER — Other Ambulatory Visit: Payer: Self-pay

## 2019-04-02 DIAGNOSIS — Z20822 Contact with and (suspected) exposure to covid-19: Secondary | ICD-10-CM

## 2019-04-03 LAB — NOVEL CORONAVIRUS, NAA: SARS-CoV-2, NAA: NOT DETECTED

## 2019-08-08 ENCOUNTER — Ambulatory Visit
Admission: EM | Admit: 2019-08-08 | Discharge: 2019-08-08 | Disposition: A | Discharge status: Home / Self Care | Payer: 59 | Attending: Urgent Care | Admitting: Urgent Care

## 2019-08-08 ENCOUNTER — Other Ambulatory Visit: Payer: Self-pay

## 2019-08-08 ENCOUNTER — Ambulatory Visit (INDEPENDENT_AMBULATORY_CARE_PROVIDER_SITE_OTHER)
Admission: RE | Admit: 2019-08-08 | Discharge: 2019-08-08 | Disposition: A | Discharge status: Home / Self Care | Payer: 59 | Source: Ambulatory Visit

## 2019-08-08 ENCOUNTER — Ambulatory Visit: Payer: 59

## 2019-08-08 ENCOUNTER — Ambulatory Visit (INDEPENDENT_AMBULATORY_CARE_PROVIDER_SITE_OTHER): Payer: 59

## 2019-08-08 DIAGNOSIS — Z87442 Personal history of urinary calculi: Secondary | ICD-10-CM | POA: Insufficient documentation

## 2019-08-08 DIAGNOSIS — R05 Cough: Secondary | ICD-10-CM | POA: Insufficient documentation

## 2019-08-08 DIAGNOSIS — R109 Unspecified abdominal pain: Secondary | ICD-10-CM

## 2019-08-08 DIAGNOSIS — R509 Fever, unspecified: Secondary | ICD-10-CM | POA: Diagnosis not present

## 2019-08-08 DIAGNOSIS — U071 COVID-19: Secondary | ICD-10-CM | POA: Insufficient documentation

## 2019-08-08 DIAGNOSIS — R1031 Right lower quadrant pain: Secondary | ICD-10-CM

## 2019-08-08 DIAGNOSIS — Z20822 Contact with and (suspected) exposure to covid-19: Secondary | ICD-10-CM | POA: Diagnosis not present

## 2019-08-08 DIAGNOSIS — N420 Calculus of prostate: Secondary | ICD-10-CM | POA: Insufficient documentation

## 2019-08-08 DIAGNOSIS — R52 Pain, unspecified: Secondary | ICD-10-CM

## 2019-08-08 HISTORY — DX: Urinary calculus, unspecified: N20.9

## 2019-08-08 LAB — CBC WITH DIFFERENTIAL/PLATELET
Abs Immature Granulocytes: 0.01 10*3/uL (ref 0.00–0.07)
Basophils Absolute: 0 10*3/uL (ref 0.0–0.1)
Basophils Relative: 1 %
Eosinophils Absolute: 0 10*3/uL (ref 0.0–0.5)
Eosinophils Relative: 1 %
HCT: 46.1 % (ref 39.0–52.0)
Hemoglobin: 15.8 g/dL (ref 13.0–17.0)
Immature Granulocytes: 0 %
Lymphocytes Relative: 17 %
Lymphs Abs: 0.8 10*3/uL (ref 0.7–4.0)
MCH: 31.2 pg (ref 26.0–34.0)
MCHC: 34.3 g/dL (ref 30.0–36.0)
MCV: 90.9 fL (ref 80.0–100.0)
Monocytes Absolute: 0.6 10*3/uL (ref 0.1–1.0)
Monocytes Relative: 13 %
Neutro Abs: 3.3 10*3/uL (ref 1.7–7.7)
Neutrophils Relative %: 68 %
Platelets: 189 10*3/uL (ref 150–400)
RBC: 5.07 MIL/uL (ref 4.22–5.81)
RDW: 12.8 % (ref 11.5–15.5)
WBC: 4.9 10*3/uL (ref 4.0–10.5)
nRBC: 0 % (ref 0.0–0.2)

## 2019-08-08 LAB — URINALYSIS, COMPLETE (UACMP) WITH MICROSCOPIC
Bilirubin Urine: NEGATIVE
Glucose, UA: NEGATIVE mg/dL
Hgb urine dipstick: NEGATIVE
Ketones, ur: NEGATIVE mg/dL
Leukocytes,Ua: NEGATIVE
Nitrite: NEGATIVE
Protein, ur: NEGATIVE mg/dL
Specific Gravity, Urine: >1.03 — ABNORMAL HIGH (ref 1.005–1.030)
pH: 6 (ref 5.0–8.0)

## 2019-08-08 LAB — BASIC METABOLIC PANEL
Anion gap: 5 (ref 5–15)
BUN: 11 mg/dL (ref 6–20)
CO2: 29 mmol/L (ref 22–32)
Calcium: 8.8 mg/dL — ABNORMAL LOW (ref 8.9–10.3)
Chloride: 101 mmol/L (ref 98–111)
Creatinine, Ser: 1.12 mg/dL (ref 0.61–1.24)
GFR calc Af Amer: >60 mL/min (ref >60)
GFR calc non Af Amer: >60 mL/min (ref >60)
Glucose, Bld: 91 mg/dL (ref 70–99)
Potassium: 4.1 mmol/L (ref 3.5–5.1)
Sodium: 135 mmol/L (ref 135–145)

## 2019-08-08 LAB — PSA: Prostatic Specific Antigen: 1.16 ng/mL (ref 0.00–4.00)

## 2019-08-08 NOTE — ED Triage Notes (Signed)
Pt presents with c/o night sweats last night, chills, fever 100.4 this morning and productive cough. Pt denies n/v/d. Pt also reports right flank pain radiating to his lower right groin along with darker colored urine, these symptoms have been present for about 5 days. Pt does have history of kidney stones.

## 2019-08-08 NOTE — ED Notes (Signed)
No PA needed for CT Renal Stone Study

## 2019-08-08 NOTE — ED Provider Notes (Signed)
Virtual Visit via Video Note:  [name]  initiated request for Telemedicine visit with [facility] team. I connected with [place]  on 08/08/2019 at 10:38 AM  for a synchronized telemedicine visit using a video enabled HIPPA compliant telemedicine application. I verified that I am speaking with [name]  using two identifiers. Zigmund Gottron, NP  was physically located in a [facility] Urgent care site and [facility] was located at a different location.   The limitations of evaluation and management by telemedicine as well as the availability of in-person appointments were discussed. Patient was informed that he  may incur a bill ( including co-pay) for this virtual visit encounter. Seth Stone  expressed understanding and gave verbal consent to proceed with virtual visit.     History of Present Illness:Seth Stone  is a 49 y.o. male presents with complaints of body aches and sweating which started last night. Has noted 99.4-100.1 temps. He had travelled, and flew out of state over the weekend and was with others. Returned two days ago. Has taken nyquil and dayquil which hasn't helped. History of kidney stones and states he has had 3-4 days of ache to right side and right groin, similar to kidney stone. It hasn't been as intense, has been mild, and hasn't intensified, however.  Last week noted change to urine color- darker? No blood to urine and no further urinary symptoms or changes. No nausea or vomiting Occasional loose stool- 1 soft stool today. No other abdominal pain. No headache. Slight cough this am. No congestion or sore throat. No ear pain. No known ill contacts.    Past Medical History:  Diagnosis Date  . GERD (gastroesophageal reflux disease)     No Known Allergies      Observations/Objective: Alert, oriented, non toxic in appearance. Clear coherent speech without difficulty. No increased work of breathing visualized.     Assessment and Plan: Nephrolithiasis vs covid 19 vs pyelonephritis vs viral illness discussed and considered. Recommend further evaluation in person to further assess. Patient verbalized understanding and agreeable to plan.    Follow Up Instructions:    I discussed the assessment and treatment plan with the patient. The patient was provided an opportunity to ask questions and all were answered. The patient agreed with the plan and demonstrated an understanding of the instructions.   The patient was advised to call back or seek an in-person evaluation if the symptoms worsen or if the condition fails to improve as anticipated.  I provided 15 minutes of non-face-to-face time during this encounter.    Zigmund Gottron, NP  08/08/2019 10:38 AM         Zigmund Gottron, NP 08/08/19 1059

## 2019-08-08 NOTE — Discharge Instructions (Signed)
It is difficult to determine via video the source of your symptoms, I do think a urine specimen and possibly covid testing is appropriate to further evaluate your symptoms.

## 2019-08-09 ENCOUNTER — Telehealth (HOSPITAL_COMMUNITY): Payer: Self-pay

## 2019-08-09 LAB — SARS CORONAVIRUS 2 (TAT 6-24 HRS): SARS Coronavirus 2: POSITIVE — AB

## 2019-08-09 NOTE — Telephone Encounter (Signed)
Contacted pt. And informed him of + Covid results.  Discussed with him the self-isolate for 10 days and to be 24 hrs free of fever without fever-reducing medications and symptoms are improving to return to work/communtiy.  Discussed with pt. To return to the ED for any chest pain, difficulty breathing, resp. Distress and inform staff of +Covid status.    Inform anyone you have had contact with.  Pt. Reports feeling better today..  Discussed with pt. He may use OTC medications for fever.  He is using Nyquil.  Continue to follow discharge instructions.  All questions answered and pt. Verbalized understanding.

## 2019-08-10 ENCOUNTER — Encounter: Payer: Self-pay | Admitting: Urgent Care

## 2019-08-10 LAB — URINE CULTURE: Culture: NO GROWTH

## 2019-08-10 NOTE — ED Provider Notes (Signed)
[place], [place]   Name: Seth Stone DOB: [birth year] MRN: [id number] CSN: [id number] PCP: Crecencio Mc, MD  Arrival date and time:  08/08/19 1230  Chief Complaint:  Cough and Flank Pain (right)  NOTE: Prior to seeing the patient today, I have reviewed the triage nursing documentation and vital signs. Clinical staff has updated patient's PMH/PSHx, current medication list, and drug allergies/intolerances to ensure comprehensive history available to assist in medical decision making.   History:   HPI: Humphrey Pickler is a 49 y.o. male who presents today with multiple medical complaints as follows:  Cough: Patient with complaints of fatigue, diffuse myalgias, cough, hoarse voice quality, and intermittent episodes of diaphoresis that started with acute onset last night. Patient has been febrile to a Tmax of 100.4. Cough has been non-productive with no associated shortness of breath or wheezing. He denies that he has experienced any nausea, vomiting, diarrhea, or abdominal pain. He is eating and drinking well. Patient denies any perceived alterations to his sense of taste or smell. Patient presents out of concerns for his personal health after potential SARS-CoV-2 (novel coronavirus) exposure. Patient reports that he traveled to [place] via airplane over the last few days. While there, he reports an instance where he was in an elevator with someone who was "coughing up a lung" while not wearing a face mask. No one else is his home has experienced a similar symptom constellation. He has not been tested for SARS-CoV-2 (novel coronavirus) in the past 14 days; last tested negative on 04/13/2019 per his report. Patient has not been vaccinated for influenza this season. Despite his symptoms, patient has not taken any over the counter interventions to help improve/relieve his reported symptoms at home with the exception of some APAP for his fever.   Urinary symptoms: Patient also with complaints of  RIGHT lower back pain with (+) radiation into his groin. Symptoms started about 5 days ago. Patient has appreciated that his urine has been darker in color as compared to normal. He denies dysuria, urgency, frequency, and gross hematuria. PMH (+) for urolithiasis. He last had a urinary calculus in 2018 that required ESWL. Patient reporting that his symptoms over the last week feel similar to previous stones.   Past Medical History:  Diagnosis Date  . GERD (gastroesophageal reflux disease)   . Urolithiasis     Past Surgical History:  Procedure Laterality Date  . CERVICAL DISC ARTHROPLASTY     C6/7  . hemorrhiordectomy  2012  . LITHOTRIPSY  2017  . NASAL SEPTUM SURGERY  2014  . NASAL SINUS SURGERY  2016    Family History  Problem Relation Age of Onset  . Diabetes Mother   . Alcohol abuse Father   . Arthritis Father   . Hypertension Father   . Emphysema Sister   . Colon cancer Maternal Grandmother   . Stroke Maternal Grandmother     Social History   Tobacco Use  . Smoking status: Never Smoker  . Smokeless tobacco: Never Used  Substance Use Topics  . Alcohol use: Yes  . Drug use: No    Patient Active Problem List   Diagnosis Date Noted  . Aortic atherosclerosis ([place]) 02/19/2018  . Neck pain on right side 01/14/2018  . Elevated blood pressure reading without diagnosis of hypertension 07/11/2017  . B12 deficiency 11/19/2016  . Vitamin D deficiency 11/19/2016  . Chronic right shoulder pain 11/19/2016  . Cough 11/19/2016  . Obesity (BMI 30.0-34.9) 11/19/2016  . Chronic congestion  of paranasal sinus 11/19/2016  . Gastroesophageal reflux disease with esophagitis 11/19/2016    Home Medications:    Current Meds  Medication Sig  . DM-Doxylamine-Acetaminophen (NYQUIL COLD & FLU PO) Take by mouth.  Marland Kitchen omeprazole (PRILOSEC OTC) 20 MG tablet Take by mouth.    Allergies:   Patient has no known allergies.  Review of Systems (ROS):  Review of systems NEGATIVE unless  otherwise noted in narrative H&P section.   Vital Signs: Today's Vitals   08/08/19 1301 08/08/19 1302 08/08/19 1305 08/08/19 1500  BP:   (!) 139/93   Pulse:   71   Resp:   18   Temp:   98.8 F (37.1 C)   TempSrc:   Oral   SpO2:   100%   Weight:  235 lb (106.6 kg)    Height:  6\' 1"  (1.854 m)    PainSc: 3    3     Physical Exam: Physical Exam  Constitutional: He is oriented to person, place, and time and well-developed, well-nourished, and in no distress.  HENT:  Head: Normocephalic and atraumatic.  Nose: Rhinorrhea (mild) and sinus tenderness (mild) present.  Mouth/Throat: Uvula is midline and mucous membranes are normal. Posterior oropharyngeal edema (mild) and posterior oropharyngeal erythema present. No oropharyngeal exudate.  Eyes: Pupils are equal, round, and reactive to light.  Cardiovascular: Normal rate, regular rhythm, normal heart sounds and intact distal pulses.  Pulmonary/Chest: Effort normal and breath sounds normal.  No cough noted in clinic. No SOB or increased WOB. No distress. Able to speak in complete sentences without difficulties. SPO2 100% on RA.  Abdominal: Soft. Normal appearance and bowel sounds are normal. He exhibits no distension. There is no abdominal tenderness. There is CVA tenderness (mild on RIGHT side).  Genitourinary:    Genitourinary Comments: Exam deferred. No penile bleeding or discharge. No testicular symptoms.   Neurological: He is alert and oriented to person, place, and time. Gait normal.  Skin: Skin is warm and dry. No rash noted. He is not diaphoretic.  Psychiatric: Mood, memory, affect and judgment normal.  Nursing note and vitals reviewed.   Urgent Care Treatments / Results:   Orders Placed This Encounter  Procedures  . SARS CORONAVIRUS 2 (TAT 6-24 HRS) Nasopharyngeal Nasopharyngeal Swab  . Urine culture  . DG Abdomen 1 View  . CT Renal Stone Study  . Urinalysis, Complete w Microscopic  . CBC with Differential  . Basic  metabolic panel    LABS: PLEASE NOTE: all labs that were ordered this encounter are listed, however only abnormal results are displayed.  Labs Reviewed  SARS CORONAVIRUS 2 (TAT 6-24 HRS)  URINALYSIS, COMPLETE (UACMP) WITH MICROSCOPIC - Abnormal; Notable for the following components:   Specific Gravity, Urine >1.030 (*)    Bacteria, UA RARE (*)    All other components within normal limits  BASIC METABOLIC PANEL - Abnormal; Notable for the following components:   Calcium 8.8 (*)    All other components within normal limits  URINE CULTURE  CBC WITH DIFFERENTIAL/PLATELET  PSA    EKG: -None  RADIOLOGY: DG Abdomen 1 View  Result Date: 08/08/2019 CLINICAL DATA:  Pain EXAM: ABDOMEN - 1 VIEW COMPARISON:  None. FINDINGS: There is no bowel dilatation or air-fluid level to suggest bowel obstruction. No free air. There is moderate stool in the colon. There are small prostatic calculi. Bony structures appear intact. IMPRESSION: Prostatic calculi. No other abnormal calcifications. No bowel obstruction or free air evident. Electronically Signed  By: Lowella Grip III M.D.   On: 08/08/2019 13:53   CT Renal Stone Study  Result Date: 08/08/2019 CLINICAL DATA:  Right flank pain, hematuria, history of kidney stone EXAM: CT ABDOMEN AND PELVIS WITHOUT CONTRAST TECHNIQUE: Multidetector CT imaging of the abdomen and pelvis was performed following the standard protocol without IV contrast. COMPARISON:  02/17/2018 FINDINGS: Lower chest: No acute abnormality. Hepatobiliary: No solid liver abnormality is seen. No gallstones, gallbladder wall thickening, or biliary dilatation. Pancreas: Unremarkable. No pancreatic ductal dilatation or surrounding inflammatory changes. Spleen: Normal in size without significant abnormality. Adrenals/Urinary Tract: Adrenal glands are unremarkable. There is a small nonobstructive calculus of the inferior pole of the left kidney and multiple additional very tiny punctuate  nonobstructive calculi in the calices bilaterally. Bladder is unremarkable. Stomach/Bowel: Stomach is within normal limits. Appendix appears normal. No evidence of bowel wall thickening, distention, or inflammatory changes. Vascular/Lymphatic: Aortic atherosclerosis. No enlarged abdominal or pelvic lymph nodes. Reproductive: No mass or other significant abnormality. Other: Small fat containing umbilical hernia. No abdominopelvic ascites. Musculoskeletal: No acute or significant osseous findings. IMPRESSION: 1. There is a small nonobstructive calculus of the inferior pole of the left kidney and multiple additional very tiny punctuate nonobstructive calculi in the calices bilaterally, new compared to prior examination. No ureteral calculus or hydronephrosis. 2. Aortic Atherosclerosis (ICD10-I70.0). Electronically Signed   By: Eddie Candle M.D.   On: 08/08/2019 16:01    PROCEDURES: Procedures  MEDICATIONS RECEIVED THIS VISIT: Medications - No data to display  PERTINENT CLINICAL COURSE NOTES/UPDATES:   Initial Impression / Assessment and Plan / Urgent Care Course:  Pertinent labs & imaging results that were available during my care of the patient were personally reviewed by me and considered in my medical decision making (see lab/imaging section of note for values and interpretations).  [address] Ac[place]on Wallach is a 49 y.o. male who presents to [facility] today with complaints of Cough and Flank Pain (right)  Patient overall well appearing and in no acute distress today in clinic. Presenting symptoms (see HPI) and exam as documented above. Patient with multiple medical complaints today. Will address as follows:  Upper respiratory symptoms:  Presents with symptoms associated with SARS-CoV-2 (novel coronavirus). Discussed typical symptom constellation. Reviewed potential for infection and need for testing. Patient amenable to being tested. SARS-CoV-2 swab collected by certified clinical staff.  Discussed variable turn around times associated with testing, as swabs are being processed at the main campus of [facility] in [place], and have been taking 12-24 hours to come back. He was advised to self quarantine, per [facility] DHHS guidelines, until negative results received. These measures are being implemented out of an abundance of caution to prevent transmission and spread during the current SARS-CoV-2 pandemic.   Presenting symptoms consistent with acute viral illness. SARS-CoV-2 HIGHLY SUSPECTED. Discussed that, until ruled out with confirmatory lab testing, SARS-CoV-2 remains part of the differential. His testing is pending at this time. I discussed with him that his symptoms are felt to be viral in nature, thus antibiotics would not offer him any relief or improve his symptoms any faster than conservative symptomatic management. Discussed supportive care measures at home during acute phase of illness. Patient to rest as much as possible. He was encouraged to ensure adequate hydration (water and ORS) to prevent dehydration and electrolyte derangements. Patient may use APAP and/or IBU on an as needed basis for pain/fever.     Current clinical condition warrants patient being out of work in order to  quarantine while waiting for testing results. RTW is contingent on his SARS-CoV-2 test results being reviewed as negative.     Urinary symptoms: PMH (+) for urolithiasis. Patient febrile and having back and flank pain. No dysuria or gross hematuria. UA today negative for infection; 0-5 WBC/hpf, 0-5 RBC/hpf, rare bacteria, and no nitrites or LE. KUB obtained to assess for urinary calculus. Imaging reviewed. Radiographs reveal multiple prostatic calculi. Discussed presentation, assessment, and workup with urology APP ([place], PA-C). Recommendations received for labs (urine C&S, PSA, CBC, BMP), and CT stone study. Orders placed and testing resulted as follows:   CBC normal; no leukocytosis.    BMP  normal. BUN 11 and creatinine 1.12 mg/dL. Estimated Creatinine Clearance: 103.4 mL/min (by C-G formula based on SCr of 1.12 mg/dL).   PSA 1.16 ng/mL.   Results reviewed with patient. Discussed recommendations for CT scan to look for urinary calculus. Patient amenable to imaging. Scan unable to performed at [facility], however we are able to get scan done today at [facility]. Discussed proceeding with imaging today vs. RTC tomorrow here for scan. Patient wishes to proceed with imaging today. Information given regarding location and check-in at the medical mall. Patient advised that results would be called to him following the scan, at which time any recommendations for required treatments would also be discussed. Patient verbalized understanding.    CT abd/pelvis without contrast revealed non-obstructive renal calculus on the LEFT and multiple punctate calculi in the patient's BILATERAL renal calices. There was no hydroureter or hydronephrosis noted. Radiologist did not comment on prostatic calculi on CT imaging.   Results were called to patient and discussed. No need for immediate intervention at this time. Encouraged to increase fluid intake. May use Tylenol and/or Ibuprofen as needed for pain/fever.   Discussed follow up with primary care physician in 1 week for re-evaluation. I have reviewed the follow up and strict return precautions for any new or worsening symptoms. Patient is aware of symptoms that would be deemed urgent/emergent, and would thus require further evaluation either here or in the emergency department. At the time of discharge, he verbalized understanding and consent with the discharge plan as it was reviewed with him. All questions were fielded by provider and/or clinic staff prior to patient discharge.    Final Clinical Impressions / Urgent Care Diagnoses:   Final diagnoses:  Suspected COVID-19 virus infection  Encounter for laboratory testing for COVID-19 virus  Personal history of  kidney stones  Prostatic calculi    New Prescriptions:  [place] Controlled Substance Registry consulted? Not Applicable  No orders of the defined types were placed in this encounter.   Recommended Follow up Care:  Patient encouraged to follow up with the following provider within the specified time frame, or sooner as dictated by the severity of his symptoms. As always, he was instructed that for any urgent/emergent care needs, he should seek care either here or in the emergency department for more immediate evaluation.  NOTE: This note was prepared using Lobbyist along with smaller Company secretary. Despite my best ability to proofread, there is the potential that transcriptional errors may still occur from this process, and are completely unintentional.     Karen Kitchens, NP 08/10/19 2203

## 2019-08-20 ENCOUNTER — Ambulatory Visit (INDEPENDENT_AMBULATORY_CARE_PROVIDER_SITE_OTHER)
Admission: RE | Admit: 2019-08-20 | Discharge: 2019-08-20 | Disposition: A | Discharge status: Home / Self Care | Payer: 59 | Source: Ambulatory Visit

## 2019-08-20 DIAGNOSIS — J329 Chronic sinusitis, unspecified: Secondary | ICD-10-CM

## 2019-08-20 DIAGNOSIS — R05 Cough: Secondary | ICD-10-CM | POA: Diagnosis not present

## 2019-08-20 DIAGNOSIS — R062 Wheezing: Secondary | ICD-10-CM

## 2019-08-20 DIAGNOSIS — U071 COVID-19: Secondary | ICD-10-CM

## 2019-08-20 DIAGNOSIS — R519 Headache, unspecified: Secondary | ICD-10-CM

## 2019-08-20 DIAGNOSIS — Z20822 Contact with and (suspected) exposure to covid-19: Secondary | ICD-10-CM | POA: Insufficient documentation

## 2019-08-20 DIAGNOSIS — R059 Cough, unspecified: Secondary | ICD-10-CM

## 2019-08-20 MED ORDER — AMOXICILLIN-POT CLAVULANATE 875-125 MG PO TABS: 1.0000 | ORAL_TABLET | Freq: Two times a day (BID) | ORAL | 0 refills | Status: DC

## 2019-08-20 NOTE — ED Provider Notes (Signed)
Virtual Visit via Video Note:  Seth Stone  initiated request for Telemedicine visit with [facility] Urgent [facility] team. I connected with [place]  on 08/20/2019 at 8:25 AM  for a synchronized telemedicine visit using a video enabled HIPPA compliant telemedicine application. I verified that I am speaking with [facility]  using two identifiers. Jaynee Eagles, PA-C  was physically located in a [facility] Urgent care site and [facility] was located at a different location.   The limitations of evaluation and management by telemedicine as well as the availability of in-person appointments were discussed. Patient was informed that he  may incur a bill ( including co-pay) for this virtual visit encounter. [place]  expressed understanding and gave verbal consent to proceed with virtual visit.     History of Present Illness:Seth Stone  is a 49 y.o. male presents with 5 day hx of acute onset of persistent frontal headaches, recurrent sinusitis. Has been using Excedrin for migraines, usually uses this with some relief but not this episode. Has had post-nasal drainage. Has a hx of having had 4-5 sinus surgeries.  Has also had persistent cough, feels a little wheezing.   ROS  No current facility-administered medications for this encounter.   Current Outpatient Medications  Medication Sig Dispense Refill  . DM-Doxylamine-Acetaminophen (NYQUIL COLD & FLU PO) Take by mouth.    Marland Kitchen omeprazole (PRILOSEC OTC) 20 MG tablet Take by mouth.    . traMADol (ULTRAM) 50 MG tablet TAKE 1 TABLET (50 MG TOTAL) BY MOUTH EVERY 8 (EIGHT) HOURS AS NEEDED. 30 tablet 1   Facility-Administered Medications Ordered in Other Encounters  Medication Dose Route Frequency Provider Last Rate Last Admin  . albuterol (PROVENTIL) (2.5 MG/3ML) 0.083% nebulizer solution 2.5 mg  2.5 mg Nebulization Once Laverle Hobby, MD         No Known Allergies   Past Medical History:  Diagnosis  Date  . GERD (gastroesophageal reflux disease)   . Urolithiasis     Past Surgical History:  Procedure Laterality Date  . CERVICAL DISC ARTHROPLASTY     C6/7  . hemorrhiordectomy  2012  . LITHOTRIPSY  2017  . NASAL SEPTUM SURGERY  2014  . NASAL SINUS SURGERY  2016      Observations/Objective: Physical Exam Constitutional:      General: He is not in acute distress.    Appearance: Normal appearance. He is not ill-appearing, toxic-appearing or diaphoretic.  HENT:     Head: Atraumatic.  Eyes:     General: No scleral icterus.       Right eye: No discharge.        Left eye: No discharge.     Extraocular Movements: Extraocular movements intact.  Pulmonary:     Effort: Pulmonary effort is normal.  Neurological:     Mental Status: He is alert and oriented to person, place, and time.  Psychiatric:        Mood and Affect: Mood normal.        Behavior: Behavior normal.        Thought Content: Thought content normal.        Judgment: Judgment normal.     BP Readings from Last 3 Encounters:  08/08/19 (!) 139/93  01/11/18 136/84  12/27/17 138/80   Assessment and Plan:  1. Chronic recurrent sinusitis   2. Cough   3. Wheezing   4. COVID-19    Patient needs Augmentin to manage recurrent chronic sinusitis.  Offered patient prednisone course and albuterol inhaler but he prefers to avoid things for now.  Recommended patient come in for an in person office visit if his symptoms persist.  He is agreeable to this. Counseled patient on potential for adverse effects with medications prescribed/recommended today, ER and return-to-clinic precautions discussed, patient verbalized understanding.    Follow Up Instructions:    I discussed the assessment and treatment plan with the patient. The patient was provided an opportunity to ask questions and all were answered. The patient agreed with the plan and demonstrated an understanding of the instructions.   The patient was advised to call  back or seek an in-person evaluation if the symptoms worsen or if the condition fails to improve as anticipated.  I provided 10 minutes of non-face-to-face time during this encounter.    Jaynee Eagles, PA-C  08/20/2019 8:25 AM         Jaynee Eagles, PA-C 08/20/19 [phone]

## 2019-08-30 ENCOUNTER — Telehealth: Payer: Self-pay | Admitting: Internal Medicine

## 2019-08-30 DIAGNOSIS — N200 Calculus of kidney: Secondary | ICD-10-CM

## 2019-08-30 NOTE — Telephone Encounter (Signed)
Pt had a CT scan done on 08/08/2019 that showed kidney stones in the left kidney. Pt was seen at [facility] urgent care on 08/08/2019 for urinary symptoms and back pain.

## 2019-08-30 NOTE — Telephone Encounter (Signed)
Patient needs a recommendation for a urologist for his kidney stones.

## 2019-08-30 NOTE — Telephone Encounter (Signed)
Hollice Espy,  Scott Stoioiff,  Dr Aaron Edelman?)  Diamantina Providence,  And Dr  Sherren Mocha McDiarmid are all exellent

## 2019-08-31 NOTE — Telephone Encounter (Signed)
Spoke with pt and informed him of the doctors at [facility] that Dr. Derrel Nip has recommended.  Pt asked that I send it to him in a mychart message. Mychart message has been sent.

## 2019-09-05 ENCOUNTER — Telehealth: Payer: Self-pay | Admitting: Internal Medicine

## 2019-09-05 NOTE — Telephone Encounter (Signed)
Patient was wondering if referral order was done.

## 2019-09-05 NOTE — Telephone Encounter (Signed)
Referral is in process as requested 

## 2019-09-05 NOTE — Addendum Note (Signed)
Addended by: Crecencio Mc on: 09/05/2019 05:24 PM   Modules accepted: Orders

## 2019-09-05 NOTE — Telephone Encounter (Signed)
Sorry my message did not send to you. But pt called [facility] and they stated that pt would need a referral in order to schedule an appt. Pt is wondering if you could place the referral. He was seen for kidney stones at [facility].

## 2019-09-06 NOTE — Telephone Encounter (Signed)
Sent pt a mychart message letting him know referral has been placed.

## 2019-09-17 ENCOUNTER — Other Ambulatory Visit: Payer: Self-pay | Admitting: Family Medicine

## 2019-09-17 DIAGNOSIS — N200 Calculus of kidney: Secondary | ICD-10-CM

## 2019-09-18 ENCOUNTER — Other Ambulatory Visit
Admission: RE | Admit: 2019-09-18 | Discharge: 2019-09-18 | Disposition: A | Discharge status: Home / Self Care | Payer: 59 | Source: Home / Self Care | Attending: Urology | Admitting: Urology

## 2019-09-18 ENCOUNTER — Telehealth: Payer: Self-pay

## 2019-09-18 ENCOUNTER — Ambulatory Visit
Admission: RE | Admit: 2019-09-18 | Discharge: 2019-09-18 | Disposition: A | Discharge status: Home / Self Care | Payer: 59 | Source: Ambulatory Visit | Attending: Urology | Admitting: Urology

## 2019-09-18 ENCOUNTER — Ambulatory Visit: Payer: 59 | Admitting: Urology

## 2019-09-18 ENCOUNTER — Encounter: Payer: Self-pay | Admitting: Urology

## 2019-09-18 ENCOUNTER — Ambulatory Visit
Admission: RE | Admit: 2019-09-18 | Discharge: 2019-09-18 | Disposition: A | Discharge status: Home / Self Care | Payer: 59 | Attending: Urology | Admitting: Urology

## 2019-09-18 ENCOUNTER — Other Ambulatory Visit: Payer: Self-pay

## 2019-09-18 VITALS — BP 146/84 | HR 60 | Ht 73.0 in | Wt 230.0 lb

## 2019-09-18 DIAGNOSIS — Z113 Encounter for screening for infections with a predominantly sexual mode of transmission: Secondary | ICD-10-CM

## 2019-09-18 DIAGNOSIS — N200 Calculus of kidney: Secondary | ICD-10-CM

## 2019-09-18 DIAGNOSIS — K4090 Unilateral inguinal hernia, without obstruction or gangrene, not specified as recurrent: Secondary | ICD-10-CM

## 2019-09-18 DIAGNOSIS — Z3141 Encounter for fertility testing: Secondary | ICD-10-CM | POA: Diagnosis not present

## 2019-09-18 LAB — URINALYSIS, COMPLETE (UACMP) WITH MICROSCOPIC
Bacteria, UA: NONE SEEN
Glucose, UA: NEGATIVE mg/dL
Leukocytes,Ua: NEGATIVE
Nitrite: NEGATIVE
Protein, ur: 30 mg/dL — AB
Specific Gravity, Urine: 1.025 (ref 1.005–1.030)
Squamous Epithelial / HPF: NONE SEEN (ref 0–5)
pH: 6 (ref 5.0–8.0)

## 2019-09-18 LAB — CHLAMYDIA/NGC RT PCR (ARMC ONLY)
Chlamydia Tr: NOT DETECTED
N gonorrhoeae: NOT DETECTED

## 2019-09-18 NOTE — Telephone Encounter (Signed)
-----   Message from Billey Co, MD sent at 09/18/2019  3:56 PM EDT ----- Charlott Rakes- please let him know STD testing negative. Can see left renal stone on KUB, so we can arrange shockwave next week.  Leah, please set up LEFT SWL with me 5/6, thanks.  Nickolas Madrid, MD 09/18/2019

## 2019-09-18 NOTE — Progress Notes (Signed)
09/18/19 9:48 AM   [place] 1971/02/14 [id number]  CC: Urethral discharge, nephrolithiasis, fertility questions  HPI: I saw [facility] office in urology clinic for the above issues.  He was recently seen in urgent care with right-sided abdominal pain and overall malaise and ultimately was diagnosed with Covid.  CT was performed at that time with his history of nephrolithiasis, and this showed a 4 mm left midpole nonobstructing stone.  He does have a history of nephrolithiasis requiring ureteroscopy 15 years ago, as well as shockwave lithotripsy recently.  He works as a Academic librarian, and would like any non-obstructing stones treated.  He also reports some mucousy urethral discharge over the last 6 to 8 months.  He denies any dysuria or other urinary voiding complaints.  He denies a history of STDs.  He has not been tested for STDs recently.  He does have a new sexual partner.  He also reports some right-sided groin and scrotal pain that is worse with physical activity.  CT does show a small right-sided inguinal hernia with some fat in the inguinal canal.  Finally, he is interested to know his fertility status, as he may be interested in having a child with his new partner.  He denies any gross hematuria or flank pain at this time.  Urinalysis today is benign with no bacteria, 0-5 RBCs, no squamous cells, 0-5 WBCs, mucus present, nitrite negative, no leukocytes.   PMH: Past Medical History:  Diagnosis Date  . GERD (gastroesophageal reflux disease)   . Kidney stone   . Urolithiasis     Surgical History: Past Surgical History:  Procedure Laterality Date  . CERVICAL DISC ARTHROPLASTY     C6/7  . hemorrhiordectomy  2012  . LITHOTRIPSY  2017  . NASAL SEPTUM SURGERY  2014  . NASAL SINUS SURGERY  2016  . URETEROSCOPY      Family History: Family History  Problem Relation Age of Onset  . Diabetes Mother   . Alcohol abuse Father   . Arthritis Father   . Hypertension Father     . Emphysema Sister   . Colon cancer Maternal Grandmother   . Stroke Maternal Grandmother     Social History:  reports that he has never smoked. He has never used smokeless tobacco. He reports current alcohol use. He reports that he does not use drugs.  Physical Exam: BP (!) 146/84 (BP Location: Left Arm, Patient Position: Sitting, Cuff Size: Normal)   Pulse 60   Ht 6\' 1"  (1.854 m)   Wt 230 lb (104.3 kg)   BMI 30.34 kg/m    Constitutional:  Alert and oriented, No acute distress. Cardiovascular: No clubbing, cyanosis, or edema. Respiratory: Normal respiratory effort, no increased work of breathing. GI: Abdomen is soft, nontender, nondistended, no abdominal masses GU: Uncircumcised phallus with patent meatus, testicles 20 cc and descended bilaterally without masses.  Mild right inguinal canal and testicular tenderness.  No discharge or lesions. DRE: 40 g, smooth, no nodules  Laboratory Data: Reviewed, see HPI  Pertinent Imaging: I have personally reviewed the CT stone protocol.  There is a 4 mm left midpole stone that is nonobstructing.  9 cm kidney stone distance, 300HU, not clearly seen on scout CT.  Assessment & Plan:   In summary, he is a healthy 49 year old male who works as a Insurance underwriter who was recently found to have a 4 mm left midpole nonobstructing renal stone that he is interested in treatment for.  He also has had  some urethral discharge without any other urinary symptoms over the last few months and is interested in STD testing.  Finally, he is also interested in evaluating his fertility with a semen analysis.  We discussed various treatment options for urolithiasis including observation with or without medical expulsive therapy, shockwave lithotripsy (SWL), ureteroscopy and laser lithotripsy with stent placement, and percutaneous nephrolithotomy.  We discussed that management is based on stone size, location, density, patient co-morbidities, and patient preference.   Stones  <85mm in size have a >80% spontaneous passage rate. Data surrounding the use of tamsulosin for medical expulsive therapy is controversial, but meta analyses suggests it is most efficacious for distal stones between 5-61mm in size. Possible side effects include dizziness/lightheadedness, and retrograde ejaculation.  SWL has a lower stone free rate in a single procedure, but also a lower complication rate compared to ureteroscopy and avoids a stent and associated stent related symptoms. Possible complications include renal hematoma, steinstrasse, and need for additional treatment.   Ureteroscopy with laser lithotripsy and stent placement has a higher stone free rate than SWL in a single procedure, however increased complication rate including possible infection, ureteral injury, bleeding, and stent related morbidity. Common stent related symptoms include dysuria, urgency/frequency, and flank pain.  -KUB today to evaluate if stone visible for left shockwave lithotripsy -STD testing today -Semen analysis drop-off instructions given to evaluate fertility Will call with above results this week, schedule L SWL 5/6 if stone visible on [name], MD 09/18/2019  [facility] [address], [place], [place] [address] [phone]

## 2019-09-18 NOTE — Patient Instructions (Addendum)
Semen analysis must be dropped off at our [facility] [place] is a treatment that can sometimes help eliminate kidney stones and the pain that they cause. A form of lithotripsy, also known as extracorporeal shock wave lithotripsy, is a nonsurgical procedure that crushes a kidney stone with shock waves. These shock waves pass through your body and focus on the kidney stone. They cause the kidney stone to break up while it is still in the urinary tract. This makes it easier for the smaller pieces of stone to pass in the urine. Tell a health care provider about:  Any allergies you have.  All medicines you are taking, including vitamins, herbs, eye drops, creams, and over-the-counter medicines.  Any blood disorders you have.  Any surgeries you have had.  Any medical conditions you have.  Whether you are pregnant or may be pregnant.  Any problems you or family members have had with anesthetic medicines. What are the risks? Generally, this is a safe procedure. However, problems may occur, including:  Infection.  Bleeding of the kidney.  Bruising of the kidney or skin.  Scarring of the kidney, which can lead to: ? Increased blood pressure. ? Poor kidney function. ? Return (recurrence) of kidney stones.  Damage to other structures or organs, such as the liver, colon, spleen, or pancreas.  Blockage (obstruction) of the the tube that carries urine from the kidney to the bladder (ureter).  Failure of the kidney stone to break into pieces (fragments). What happens before the procedure? Staying hydrated Follow instructions from your health care provider about hydration, which may include:  Up to 2 hours before the procedure - you may continue to drink clear liquids, such as water, clear fruit juice, black coffee, and plain tea. Eating and drinking restrictions Follow instructions from your health care provider about eating and drinking, which may  include:  8 hours before the procedure - stop eating heavy meals or foods such as meat, fried foods, or fatty foods.  6 hours before the procedure - stop eating light meals or foods, such as toast or cereal.  6 hours before the procedure - stop drinking milk or drinks that contain milk.  2 hours before the procedure - stop drinking clear liquids. General instructions  Plan to have someone take you home from the hospital or clinic.  Ask your health care provider about: ? Changing or stopping your regular medicines. This is especially important if you are taking diabetes medicines or blood thinners. ? Taking medicines such as aspirin and ibuprofen. These medicines and other NSAIDs can thin your blood. Do not take these medicines for 7 days before your procedure if your health care provider instructs you not to.  You may have tests, such as: ? Blood tests. ? Urine tests. ? Imaging tests, such as a CT scan. What happens during the procedure?  To lower your risk of infection: ? Your health care team will wash or sanitize their hands. ? Your skin will be washed with soap.  An IV tube will be inserted into one of your veins. This tube will give you fluids and medicines.  You will be given one or more of the following: ? A medicine to help you relax (sedative). ? A medicine to make you fall asleep (general anesthetic).  A water-filled cushion may be placed behind your kidney or on your abdomen. In some cases you may be placed in a tub of lukewarm water.  Your body will be  positioned in a way that makes it easy to target the kidney stone.  A flexible tube with holes in it (stent) may be placed in the ureter. This will help keep urine flowing from the kidney if the fragments of the stone have been blocking the ureter.  An X-ray or ultrasound exam will be done to locate your stone.  Shock waves will be aimed at the stone. If you are awake, you may feel a tapping sensation as the shock  waves pass through your body. The procedure may vary among health care providers and hospitals. What happens after the procedure?  You may have an X-ray to see whether the procedure was able to break up the kidney stone and how much of the stone has passed. If large stone fragments remain after treatment, you may need to have a second procedure at a later time.  Your blood pressure, heart rate, breathing rate, and blood oxygen level will be monitored until the medicines you were given have worn off.  You may be given antibiotics or pain medicine as needed.  If a stent was placed in your ureter during surgery, it may stay in place for a few weeks.  You may need strain your urine to collect pieces of the kidney stone for testing.  You will need to drink plenty of water.  Do not drive for 24 hours if you were given a sedative. Summary  Lithotripsy is a treatment that can sometimes help eliminate kidney stones and the pain that they cause.  A form of lithotripsy, also known as extracorporeal shock wave lithotripsy, is a nonsurgical procedure that crushes a kidney stone with shock waves.  Generally, this is a safe procedure. However, problems may occur, including damage to the kidney or other organs, infection, or obstruction of the tube that carries urine from the kidney to the bladder (ureter).  When you go home, you will need to drink plenty of water. You may be asked to strain your urine to collect pieces of the kidney stone for testing. This information is not intended to replace advice given to you by your health care provider. Make sure you discuss any questions you have with your health care provider. Document Revised: 08/21/2018 Document Reviewed: 03/31/2016 Elsevier Patient Education  2020 [employer].   Semen Analysis Test Why am I having this test? A semen analysis test is done to check certain aspects of the health of a man's reproductive organs (testes) and the hormone  system that plays a role in semen production. Semen is a whitish secretion that is released from the penis during the final phase of orgasm (ejaculation). It is made up of liquids and nutrients from the prostate gland, seminal vesicles, and other glands. It also contains sperm cells from the testes. A single sperm cell contains one complete set of a man's genetic coding (chromosomes). You may have this test as a part of infertility testing, which is done to help find out reasons for the inability to have a child. The test may also be done to determine whether a previously performed vasectomy was successful. A vasectomy is a procedure done to make a man permanently infertile by tying the tube (the vas deferens) that collects the sperm from the testicle. A vasectomy blocks the sperm from going through the vas deferens and penis so that the sperm will not go into the vagina during sexual intercourse. What is being tested? If the test is done as part of infertility testing, the  shape (morphology), size, and movement (motility) of sperm cells will be included in the analysis. This testing may also include assessing a sperm cell's ability to penetrate an egg (fertilize) as well as the formation of genetic material (DNA). If the test is done to check the success of a vasectomy, the sample will be checked for the presence of sperm. What kind of sample is taken? A semen sample is required for this test. A semen sample will be collected by ejaculation into a sterile glass or plastic container provided by the lab. This can be done at home, in your health care provider's office, or in the lab. How do I collect samples at home?  If the sample will be collected at home, follow your health care provider's instructions about how to collect the sample. The sample should be delivered to the lab within 1 hour after collection. It should also be protected from extreme heat or cold. How do I prepare for this test? For  infertility testing: Avoid sexual activity for 2-3 days before the semen sample collection. However, do not avoid ejaculation for a prolonged period because this can alter the motility of sperm cells. For vasectomy success testing: Make sure that you ejaculate one or two times before the day of semen sample collection. This will clear the vas deferens of any sperm that were present before the vasectomy was performed. Tell a health care provider about:  All medicines you are taking, including vitamins, herbs, eye drops, creams, and over-the-counter medicines. How are the results reported? Your test results will be reported as values. Your health care provider will compare your results to normal ranges that were established after testing a large group of people (reference values). Reference values may vary among labs and hospitals. For this test, common reference values are:  Volume: 2-5 mL.  Liquefaction time: 20-30 minutes after collection.  Appearance: normal (whitish in color).  Motile/mL: greater than or equal to 10 million.  Sperm/mL: greater than or equal to 20 million.  Viscosity: greater than or equal to 3.  Agglutination: greater than or equal to 3.  Supravital: greater than or equal to 75% live.  Fructose: positive.  pH: 7.12-8.  Sperm count (density): greater than or equal to 20 million/mL.  Sperm motility: greater than or equal to 50% at 1 hour.  Sperm morphology: greater than 30% Tyrell Antonio criteria greater than 14%) normally shaped. What do the results mean? Low sperm count, abnormal motility, or abnormal morphology of sperm cells may all cause problems with male fertility. Abnormal results can also be caused by:  Certain infectious diseases, such as inflammation of a testis (orchitis) from mumps.  Receiving certain kinds of toxic drugs, such as chemotherapy.  Having testicles that did not develop normally in childhood. When the semen analysis test is done to  check the success of a vasectomy, the presence of sperm may mean that the surgery was not successful. Your health care provider may suggest a repeat of this test. Talk with your health care provider about what your results mean. Questions to ask your health care provider Ask your health care provider, or the department that is doing the test:  When will my results be ready?  How will I get my results?  What are my treatment options?  What other tests do I need?  What are my next steps? Summary  A semen analysis test is done to check certain aspects of the health of a man's reproductive organs (testes) and the  hormone system that plays a role in semen production.  You may have this test as a part of infertility testing, which is done to help find out reasons for the inability to have a child. The test may also be done to determine whether a previously performed vasectomy was successful.  A semen sample will be collected by ejaculation into a sterile glass or plastic container. Follow instructions about how to collect the sample, and make sure you deliver the sample to the lab within 1 hour of obtaining it.  Talk with your health care provider about what your results mean. This information is not intended to replace advice given to you by your health care provider. Make sure you discuss any questions you have with your health care provider. Document Revised: 02/03/2017 Document Reviewed: 02/03/2017 Elsevier Patient Education  [employer].   Inguinal Hernia, Adult An inguinal hernia is when fat or your intestines push through a weak spot in a muscle where your leg meets your lower belly (groin). This causes a rounded lump (bulge). This kind of hernia could also be:  In your scrotum, if you are male.  In folds of skin around your vagina, if you are male. There are three types of inguinal hernias. These include:  Hernias that can be pushed back into the belly (are reducible).  This type rarely causes pain.  Hernias that cannot be pushed back into the belly (are incarcerated).  Hernias that cannot be pushed back into the belly and lose their blood supply (are strangulated). This type needs emergency surgery. If you do not have symptoms, you may not need treatment. If you have symptoms or a large hernia, you may need surgery. Follow these instructions at home: Lifestyle  Do these things if told by your doctor so you do not have trouble pooping (constipation): ? Drink enough fluid to keep your pee (urine) pale yellow. ? Eat foods that have a lot of fiber. These include fresh fruits and vegetables, whole grains, and beans. ? Limit foods that are high in fat and processed sugars. These include foods that are fried or sweet. ? Take medicine for trouble pooping.  Avoid lifting heavy objects.  Avoid standing for long amounts of time.  Do not use any products that contain nicotine or tobacco. These include cigarettes and e-cigarettes. If you need help quitting, ask your doctor.  Stay at a healthy weight. General instructions  You may try to push your hernia in by very gently pressing on it when you are lying down. Do not try to force the bulge back in if it will not push in easily.  Watch your hernia for any changes in shape, size, or color. Tell your doctor if you see any changes.  Take over-the-counter and prescription medicines only as told by your doctor.  Keep all follow-up visits as told by your doctor. This is important. Contact a doctor if:  You have a fever.  You have new symptoms.  Your symptoms get worse. Get help right away if:  The area where your leg meets your lower belly has: ? Pain that gets worse suddenly. ? A bulge that gets bigger suddenly, and it does not get smaller after that. ? A bulge that turns red or purple. ? A bulge that is painful when you touch it.  You are a man, and your scrotum: ? Suddenly feels painful. ? Suddenly  changes in size.  You cannot push the hernia in by very gently pressing on  it when you are lying down. Do not try to force the bulge back in if it will not push in easily.  You feel sick to your stomach (nauseous), and that feeling does not go away.  You throw up (vomit), and that keeps happening.  You have a fast heartbeat.  You cannot poop (have a bowel movement) or pass gas. These symptoms may be an emergency. Do not wait to see if the symptoms will go away. Get medical help right away. Call your local emergency services (911 in the [place] Summary  An inguinal hernia is when fat or your intestines push through a weak spot in a muscle where your leg meets your lower belly (groin). This causes a rounded lump (bulge).  If you do not have symptoms, you may not need treatment. If you have symptoms or a large hernia, you may need surgery.  Avoid lifting heavy objects. Also avoid standing for long amounts of time.  Do not try to force the bulge back in if it will not push in easily. This information is not intended to replace advice given to you by your health care provider. Make sure you discuss any questions you have with your health care provider. Document Revised: 06/11/2017 Document Reviewed: 02/09/2017 Elsevier Patient Education  [employer].

## 2019-09-18 NOTE — Telephone Encounter (Signed)
Called pt informed him of the information below. Pt gave verbal understanding.  

## 2019-09-24 ENCOUNTER — Ambulatory Visit: Payer: 59 | Admitting: Surgery

## 2019-09-25 ENCOUNTER — Other Ambulatory Visit: Payer: 59

## 2019-09-26 ENCOUNTER — Other Ambulatory Visit: Payer: Self-pay | Admitting: Urology

## 2019-09-26 ENCOUNTER — Other Ambulatory Visit: Payer: Self-pay

## 2019-09-26 ENCOUNTER — Encounter: Payer: Self-pay | Admitting: Surgery

## 2019-09-26 ENCOUNTER — Other Ambulatory Visit: Payer: Self-pay | Admitting: Radiology

## 2019-09-26 ENCOUNTER — Ambulatory Visit: Payer: 59 | Admitting: Surgery

## 2019-09-26 VITALS — BP 133/82 | HR 94 | Temp 97.1°F | Resp 12 | Ht 73.0 in | Wt 230.2 lb

## 2019-09-26 DIAGNOSIS — K4090 Unilateral inguinal hernia, without obstruction or gangrene, not specified as recurrent: Secondary | ICD-10-CM | POA: Diagnosis not present

## 2019-09-26 NOTE — Patient Instructions (Addendum)
Our surgery scheduler will contact you to schedule your surgery. Please have the [facility] Sheet available when she calls you.   Contact the office if you have any questions or concerns.   Inguinal Hernia, Adult An inguinal hernia develops when fat or the intestines push through a weak spot in a muscle where your leg meets your lower abdomen (groin). This creates a bulge. This kind of hernia could also be:  In your scrotum, if you are male.  In folds of skin around your vagina, if you are male. There are three types of inguinal hernias:  Hernias that can be pushed back into the abdomen (are reducible). This type rarely causes pain.  Hernias that are not reducible (are incarcerated).  Hernias that are not reducible and lose their blood supply (are strangulated). This type of hernia requires emergency surgery. What are the causes? This condition is caused by having a weak spot in the muscles or tissues in the groin. This weak spot develops over time. The hernia may poke through the weak spot when you suddenly strain your lower abdominal muscles, such as when you:  Lift a heavy object.  Strain to have a bowel movement. Constipation can lead to straining.  Cough. What increases the risk? This condition is more likely to develop in:  Men.  Pregnant women.  People who: ? Are overweight. ? Work in jobs that require long periods of standing or heavy lifting. ? Have had an inguinal hernia before. ? Smoke or have lung disease. These factors can lead to long-lasting (chronic) coughing. What are the signs or symptoms? Symptoms may depend on the size of the hernia. Often, a small inguinal hernia has no symptoms. Symptoms of a larger hernia may include:  A lump in the groin area. This is easier to see when standing. It might not be visible when lying down.  Pain or burning in the groin. This may get worse when lifting, straining, or coughing.  A dull ache or a feeling of pressure in the  groin.  In men, an unusual lump in the scrotum. Symptoms of a strangulated inguinal hernia may include:  A bulge in your groin that is very painful and tender to the touch.  A bulge that turns red or purple.  Fever, nausea, and vomiting.  Inability to have a bowel movement or to pass gas. How is this diagnosed? This condition is diagnosed based on your symptoms, your medical history, and a physical exam. Your health care provider may feel your groin area and ask you to cough. How is this treated? Treatment depends on the size of your hernia and whether you have symptoms. If you do not have symptoms, your health care provider may have you watch your hernia carefully and have you come in for follow-up visits. If your hernia is large or if you have symptoms, you may need surgery to repair the hernia. Follow these instructions at home: Lifestyle  Avoid lifting heavy objects.  Avoid standing for long periods of time.  Do not use any products that contain nicotine or tobacco, such as cigarettes and e-cigarettes. If you need help quitting, ask your health care provider.  Maintain a healthy weight. Preventing constipation  Take actions to prevent constipation. Constipation leads to straining with bowel movements, which can make a hernia worse or cause a hernia repair to break down. Your health care provider may recommend that you: ? Drink enough fluid to keep your urine pale yellow. ? Eat foods that are  high in fiber, such as fresh fruits and vegetables, whole grains, and beans. ? Limit foods that are high in fat and processed sugars, such as fried or sweet foods. ? Take an over-the-counter or prescription medicine for constipation. General instructions  You may try to push the hernia back in place by very gently pressing on it while lying down. Do not try to force the bulge back in if it will not push in easily.  Watch your hernia for any changes in shape, size, or color. Get help right  away if you notice any changes.  Take over-the-counter and prescription medicines only as told by your health care provider.  Keep all follow-up visits as told by your health care provider. This is important. Contact a health care provider if:  You have a fever.  You develop new symptoms.  Your symptoms get worse. Get help right away if:  You have pain in your groin that suddenly gets worse.  You have a bulge in your groin that: ? Suddenly gets bigger and does not get smaller. ? Becomes red or purple or painful to the touch.  You are a man and you have a sudden pain in your scrotum, or the size of your scrotum suddenly changes.  You cannot push the hernia back in place by very gently pressing on it when you are lying down. Do not try to force the bulge back in if it will not push in easily.  You have nausea or vomiting that does not go away.  You have a fast heartbeat.  You cannot have a bowel movement or pass gas. These symptoms may represent a serious problem that is an emergency. Do not wait to see if the symptoms will go away. Get medical help right away. Call your local emergency services (911 in the [place] Summary  An inguinal hernia develops when fat or the intestines push through a weak spot in a muscle where your leg meets your lower abdomen (groin).  This condition is caused by having a weak spot in muscles or tissue in your groin.  Symptoms may depend on the size of the hernia, and they may include pain or swelling in your groin. A small inguinal hernia often has no symptoms.  Treatment may not be needed if you do not have symptoms. If you have symptoms or a large hernia, you may need surgery to repair the hernia.  Avoid lifting heavy objects. Also avoid standing for long amounts of time. This information is not intended to replace advice given to you by your health care provider. Make sure you discuss any questions you have with your health care  provider. Document Revised: 06/11/2017 Document Reviewed: 02/09/2017 Elsevier Patient Education  [place].  Umbilical Hernia, Adult  A hernia is a bulge of tissue that pushes through an opening between muscles. An umbilical hernia happens in the abdomen, near the belly button (umbilicus). The hernia may contain tissues from the small intestine, large intestine, or fatty tissue covering the intestines (omentum). Umbilical hernias in adults tend to get worse over time, and they require surgical treatment. There are several types of umbilical hernias. You may have:  A hernia located just above or below the umbilicus (indirect hernia). This is the most common type of umbilical hernia in adults.  A hernia that forms through an opening formed by the umbilicus (direct hernia).  A hernia that comes and goes (reducible hernia). A reducible hernia may be visible only when you strain,  lift something heavy, or cough. This type of hernia can be pushed back into the abdomen (reduced).  A hernia that traps abdominal tissue inside the hernia (incarcerated hernia). This type of hernia cannot be reduced.  A hernia that cuts off blood flow to the tissues inside the hernia (strangulated hernia). The tissues can start to die if this happens. This type of hernia requires emergency treatment. What are the causes? An umbilical hernia happens when tissue inside the abdomen presses on a weak area of the abdominal muscles. What increases the risk? You may have a greater risk of this condition if you:  Are obese.  Have had several pregnancies.  Have a buildup of fluid inside your abdomen (ascites).  Have had surgery that weakens the abdominal muscles. What are the signs or symptoms? The main symptom of this condition is a painless bulge at or near the belly button. A reducible hernia may be visible only when you strain, lift something heavy, or cough. Other symptoms may include:  Dull pain.  A  feeling of pressure. Symptoms of a strangulated hernia may include:  Pain that gets increasingly worse.  Nausea and vomiting.  Pain when pressing on the hernia.  Skin over the hernia becoming red or purple.  Constipation.  Blood in the stool. How is this diagnosed? This condition may be diagnosed based on:  A physical exam. You may be asked to cough or strain while standing. These actions increase the pressure inside your abdomen and force the hernia through the opening in your muscles. Your health care provider may try to reduce the hernia by pressing on it.  Your symptoms and medical history. How is this treated? Surgery is the only treatment for an umbilical hernia. Surgery for a strangulated hernia is done as soon as possible. If you have a small hernia that is not incarcerated, you may need to lose weight before having surgery. Follow these instructions at home:  Lose weight, if told by your health care provider.  Do not try to push the hernia back in.  Watch your hernia for any changes in color or size. Tell your health care provider if any changes occur.  You may need to avoid activities that increase pressure on your hernia.  Do not lift anything that is heavier than 10 lb (4.5 kg) until your health care provider says that this is safe.  Take over-the-counter and prescription medicines only as told by your health care provider.  Keep all follow-up visits as told by your health care provider. This is important. Contact a health care provider if:  Your hernia gets larger.  Your hernia becomes painful. Get help right away if:  You develop sudden, severe pain near the area of your hernia.  You have pain as well as nausea or vomiting.  You have pain and the skin over your hernia changes color.  You develop a fever. This information is not intended to replace advice given to you by your health care provider. Make sure you discuss any questions you have with your  health care provider. Document Revised: 06/22/2017 Document Reviewed: 11/08/2016 Elsevier Patient Education  [place].

## 2019-09-26 NOTE — Progress Notes (Signed)
Patient ID: Seth Stone, male   DOB: 02/04/1971, 49 y.o.   MRN: [id number]  HPI Seth Stone is a 49 y.o. male seen in consultation at the request of Dr. Derrel Nip for a symptomatic right inguinal hernia.  He reports that over the last few weeks he has experienced some intermittent pain on his right lower abdomen that radiates to the scrotum and to the inguinal canal.  The pain is mild to moderate in nature intermittent and worsening when he exercises.  He is quite active and goes to the gym at least 4 times in a week.  When he does strenuous activities he can feel that pain.  Of note he does have a history of lithotripsy and was seen by Dr. Chrystine Oiler. He denies any abdominal operations in the past.  He is able to perform more than 6 METS of activity without any shortness of breath or chest pain.  CT scan personally reviewed showing evidence of a small umbilical hernia and a right inguinal hernia.  There are no other acute abnormalities.  CBC and BMP were completely normal.  He did have Covid infection a couple months ago but is completely recovered  HPI  Past Medical History:  Diagnosis Date  . GERD (gastroesophageal reflux disease)   . Kidney stone   . Urolithiasis     Past Surgical History:  Procedure Laterality Date  . CERVICAL DISC ARTHROPLASTY     C6/7  . hemorrhiordectomy  2012  . LITHOTRIPSY  2017  . NASAL SEPTUM SURGERY  2014  . NASAL SINUS SURGERY  2016  . URETEROSCOPY      Family History  Problem Relation Age of Onset  . Diabetes Mother   . Alcohol abuse Father   . Arthritis Father   . Hypertension Father   . Emphysema Sister   . Colon cancer Maternal Grandmother   . Stroke Maternal Grandmother     Social History Social History   Tobacco Use  . Smoking status: Never Smoker  . Smokeless tobacco: Never Used  Substance Use Topics  . Alcohol use: Yes  . Drug use: No    No Known Allergies  Current Outpatient Medications  Medication Sig Dispense Refill  .  omeprazole (PRILOSEC OTC) 20 MG tablet Take by mouth.     No current facility-administered medications for this visit.   Facility-Administered Medications Ordered in Other Visits  Medication Dose Route Frequency Provider Last Rate Last Admin  . albuterol (PROVENTIL) (2.5 MG/3ML) 0.083% nebulizer solution 2.5 mg  2.5 mg Nebulization Once Laverle Hobby, MD         Review of Systems Full ROS  was asked and was negative except for the information on the HPI  Physical Exam Blood pressure 133/82, pulse 94, temperature (!) 97.1 F (36.2 C), resp. rate 12, height 6\' 1"  (1.854 m), weight 230 lb 3.2 oz (104.4 kg), SpO2 98 %. CONSTITUTIONAL: *NAD EYES: Pupils are equal, round, and reactive to light, Sclera are non-icteric. EARS, NOSE, MOUTH AND THROAT: He is wearing a mask. Hearing is intact to voice. LYMPH NODES:  Lymph nodes in the neck are normal. RESPIRATORY:  Lungs are clear. There is normal respiratory effort, with equal breath sounds bilaterally, and without pathologic use of accessory muscles. CARDIOVASCULAR: Heart is regular without murmurs, gallops, or rubs. GI: The abdomen is soft, nontender, and nondistended. There are no palpable masses. There is no hepatosplenomegaly. There are normal bowel sounds in all quadrants. There is evidence of a reducible umbilical hernia  and right inguinal hernia with mild tenderness to palpation. GU: Rectal deferred.   MUSCULOSKELETAL: Normal muscle strength and tone. No cyanosis or edema.   SKIN: Turgor is good and there are no pathologic skin lesions or ulcers. NEUROLOGIC: Motor and sensation is grossly normal. Cranial nerves are grossly intact. PSYCH:  Oriented to person, place and time. Affect is normal.  Data Reviewed  I have personally reviewed the patient's imaging, laboratory findings and medical records.    Assessment/Plan 49 year old male with clinical signs and symptoms consistent with a right inguinal hernia and umbilical hernia.   They are symptomatic.  Discussed with the patient in detail about his disease process.  I do think that th hernias need to be repair.  We can definitely repair and during the same operative setting.  I do think that he is a good candidate for robotic approach.  Discussed with patient in detail about the procedure.  Risks, benefits and possible complications including but not limited to: Bleeding, infection, chronic pain, recurrence, mesh issues and other injuries.  He agrees and wishes to proceed. A copy of this report was sent to the referring provider  Caroleen Hamman, MD FACS General Surgeon 09/26/2019, 4:17 PM

## 2019-09-27 ENCOUNTER — Encounter
Admission: RE | Disposition: A | Discharge status: Home / Self Care | Payer: Self-pay | Source: Home / Self Care | Attending: Urology

## 2019-09-27 ENCOUNTER — Encounter: Payer: Self-pay | Admitting: Urology

## 2019-09-27 ENCOUNTER — Ambulatory Visit
Admission: RE | Admit: 2019-09-27 | Discharge: 2019-09-27 | Disposition: A | Discharge status: Home / Self Care | Payer: 59 | Attending: Urology | Admitting: Urology

## 2019-09-27 ENCOUNTER — Telehealth: Payer: Self-pay | Admitting: Surgery

## 2019-09-27 ENCOUNTER — Other Ambulatory Visit: Payer: Self-pay | Admitting: Radiology

## 2019-09-27 ENCOUNTER — Ambulatory Visit: Payer: 59

## 2019-09-27 DIAGNOSIS — N200 Calculus of kidney: Secondary | ICD-10-CM | POA: Diagnosis present

## 2019-09-27 HISTORY — PX: EXTRACORPOREAL SHOCK WAVE LITHOTRIPSY: SHX1557

## 2019-09-27 SURGERY — LITHOTRIPSY, ESWL
Anesthesia: Moderate Sedation | Laterality: Left

## 2019-09-27 MED ORDER — SODIUM CHLORIDE 0.9 % IV SOLN: INTRAVENOUS | Status: DC

## 2019-09-27 MED ORDER — DIAZEPAM 5 MG PO TABS: 10.0000 mg | ORAL_TABLET | Freq: Once | ORAL | Status: AC

## 2019-09-27 MED ORDER — DIAZEPAM 5 MG PO TABS
ORAL_TABLET | ORAL | Status: AC
  Administered 2019-09-27: 10 mg via ORAL
  Filled 2019-09-27: qty 2

## 2019-09-27 MED ORDER — ONDANSETRON HCL 4 MG/2ML IJ SOLN: 4.0000 mg | Freq: Once | INTRAMUSCULAR | Status: AC

## 2019-09-27 MED ORDER — HYDROCODONE-ACETAMINOPHEN 5-325 MG PO TABS: 1.0000 | ORAL_TABLET | ORAL | 0 refills | Status: AC | PRN

## 2019-09-27 MED ORDER — CIPROFLOXACIN HCL 500 MG PO TABS: 500.0000 mg | ORAL_TABLET | Freq: Once | ORAL | Status: AC

## 2019-09-27 MED ORDER — DIPHENHYDRAMINE HCL 25 MG PO CAPS: 25.0000 mg | ORAL_CAPSULE | Freq: Once | ORAL | Status: AC

## 2019-09-27 MED ORDER — DIPHENHYDRAMINE HCL 25 MG PO CAPS
ORAL_CAPSULE | ORAL | Status: AC
  Administered 2019-09-27: 25 mg via ORAL
  Filled 2019-09-27: qty 1

## 2019-09-27 MED ORDER — ONDANSETRON HCL 4 MG/2ML IJ SOLN
INTRAMUSCULAR | Status: AC
  Administered 2019-09-27: 4 mg via INTRAVENOUS
  Filled 2019-09-27: qty 2

## 2019-09-27 MED ORDER — TAMSULOSIN HCL 0.4 MG PO CAPS
0.4000 mg | ORAL_CAPSULE | Freq: Every day | ORAL | 0 refills | Status: DC
Start: 2019-09-27 — End: 2020-07-24

## 2019-09-27 MED ORDER — CIPROFLOXACIN HCL 500 MG PO TABS
ORAL_TABLET | ORAL | Status: AC
  Administered 2019-09-27: 500 mg via ORAL
  Filled 2019-09-27: qty 1

## 2019-09-27 NOTE — Telephone Encounter (Signed)
Patient not ready to schedule his surgery yet due to work schedule.  Prior to scheduling, so he can plan accordingly, he needs to know about what his down time and restrictions will be after surgery.  Please advise.  Thank you.

## 2019-09-27 NOTE — Telephone Encounter (Signed)
Patient instructed that he will have lifting restrictions of no more than 20 pounds for 6 weeks after surgery. So no lifting, pushing, or puling of more than 20 pounds for 6 weeks total. No other restrictions.

## 2019-09-27 NOTE — Brief Op Note (Signed)
09/27/2019  4:44 PM  PATIENT:  Seth Stone  49 y.o. male  PRE-OPERATIVE DIAGNOSIS:  Left 4mm renal stone  POST-OPERATIVE DIAGNOSIS:  Same  PROCEDURE:  Procedure(s): EXTRACORPOREAL SHOCK WAVE LITHOTRIPSY (ESWL) (Left)  SURGEON:  Surgeon(s) and Role:    * Billey Co, MD - Primary  ANESTHESIA: Conscious Sedation  EBL:  None  Drains: None  Specimen: None  Findings:  1. Excellent smudging of stone on xray at conclusion of case  DISPO: Flomax, pain meds PRN, RTC 2 weeks KUB  Nickolas Madrid, MD 09/27/2019

## 2019-09-27 NOTE — Discharge Instructions (Signed)
Laser Therapy for Kidney Stones Laser therapy for kidney stones is a procedure to break up small, hard mineral deposits that form in the kidney (kidney stones). The procedure is done using a device that produces a focused beam of light (laser). The laser breaks up kidney stones into pieces that are small enough to be passed out of the body through urination or removed from the body during the procedure. You may need laser therapy if you have kidney stones that are painful or block your urinary tract. This procedure is done by inserting a tube (ureteroscope) into your kidney through the urethral opening. The urethra is the part of the body that drains urine from the bladder. In women, the urethra opens above the vaginal opening. In men, the urethra opens at the tip of the penis. The ureteroscope is inserted through the urethra, and surgical instruments are moved through the bladder and the muscular tube that connects the kidney to the bladder (ureter) until they reach the kidney. Tell a health care provider about:  Any allergies you have.  All medicines you are taking, including vitamins, herbs, eye drops, creams, and over-the-counter medicines.  Any problems you or family members have had with anesthetic medicines.  Any blood disorders you have.  Any surgeries you have had.  Any medical conditions you have.  Whether you are pregnant or may be pregnant. What are the risks? Generally, this is a safe procedure. However, problems may occur, including:  Infection.  Bleeding.  Allergic reactions to medicines.  Damage to the urethra, bladder, or ureter.  Urinary tract infection (UTI).  Narrowing of the urethra (urethral stricture).  Difficulty passing urine.  Blockage of the kidney caused by a fragment of kidney stone. What happens before the procedure? Medicines  Ask your health care provider about: ? Changing or stopping your regular medicines. This is especially important if you  are taking diabetes medicines or blood thinners. ? Taking medicines such as aspirin and ibuprofen. These medicines can thin your blood. Do not take these medicines unless your health care provider tells you to take them. ? Taking over-the-counter medicines, vitamins, herbs, and supplements. Eating and drinking Follow instructions from your health care provider about eating and drinking, which may include:  8 hours before the procedure - stop eating heavy meals or foods, such as meat, fried foods, or fatty foods.  6 hours before the procedure - stop eating light meals or foods, such as toast or cereal.  6 hours before the procedure - stop drinking milk or drinks that contain milk.  2 hours before the procedure - stop drinking clear liquids. Staying hydrated Follow instructions from your health care provider about hydration, which may include:  Up to 2 hours before the procedure - you may continue to drink clear liquids, such as water, clear fruit juice, black coffee, and plain tea.  General instructions  You may have a physical exam before the procedure. You may also have tests, such as imaging tests and blood or urine tests.  If your ureter is too narrow, your health care provider may place a soft, flexible tube (stent) inside of it. The stent may be placed days or weeks before your laser therapy procedure.  Plan to have someone take you home from the hospital or clinic.  If you will be going home right after the procedure, plan to have someone stay with you for 24 hours.  Do not use any products that contain nicotine or tobacco for at least 4  weeks before the procedure. These products include cigarettes, e-cigarettes, and chewing tobacco. If you need help quitting, ask your health care provider. °· Ask your health care provider: °? How your surgical site will be marked or identified. °? What steps will be taken to help prevent infection. These may include: °§ Removing hair at the surgery  site. °§ Washing skin with a germ-killing soap. °§ Taking antibiotic medicine. °What happens during the procedure? ° °· An IV will be inserted into one of your veins. °· You will be given one or more of the following: °? A medicine to help you relax (sedative). °? A medicine to numb the area (local anesthetic). °? A medicine to make you fall asleep (general anesthetic). °· A ureteroscope will be inserted into your urethra. The ureteroscope will send images to a video screen in the operating room to guide your surgeon to the area of your kidney that will be treated. °· A small, flexible tube will be threaded through the ureteroscope and into your bladder and ureter, up to your kidney. °· The laser device will be inserted into your kidney through the tube. Your surgeon will pulse the laser on and off to break up kidney stones. °· A surgical instrument that has a tiny wire basket may be inserted through the tube into your kidney to remove the pieces of broken kidney stone. °The procedure may vary among health care providers and hospitals. °What happens after the procedure? °· Your blood pressure, heart rate, breathing rate, and blood oxygen level will be monitored until you leave the hospital or clinic. °· You will be given pain medicine as needed. °· You may continue to receive antibiotics. °· You may have a stent temporarily placed in your ureter. °· Do not drive for 24 hours if you were given a sedative during your procedure. °· You may be given a strainer to collect any stone fragments that you pass in your urine. Your health care provider may have these tested. °Summary °· Laser therapy for kidney stones is a procedure to break up kidney stones into pieces that are small enough to be passed out of the body through urination or removed during the procedure. °· Follow instructions from your health care provider about eating and drinking before the procedure. °· During the procedure, the ureteroscope will send images  to a video screen to guide your surgeon to the area of your kidney that will be treated. °· Do not drive for 24 hours if you were given a sedative during your procedure. °This information is not intended to replace advice given to you by your health care provider. Make sure you discuss any questions you have with your health care provider. °Document Revised: 01/19/2018 Document Reviewed: 01/19/2018 °Elsevier Patient Education © 2020 [employer] ° °

## 2019-10-11 ENCOUNTER — Other Ambulatory Visit: Payer: Self-pay

## 2019-10-11 ENCOUNTER — Ambulatory Visit
Admission: RE | Admit: 2019-10-11 | Discharge: 2019-10-11 | Disposition: A | Discharge status: Home / Self Care | Payer: 59 | Source: Ambulatory Visit | Attending: Urology | Admitting: Urology

## 2019-10-11 ENCOUNTER — Ambulatory Visit (INDEPENDENT_AMBULATORY_CARE_PROVIDER_SITE_OTHER): Payer: 59 | Admitting: Urology

## 2019-10-11 ENCOUNTER — Other Ambulatory Visit: Payer: Self-pay | Admitting: Family Medicine

## 2019-10-11 ENCOUNTER — Encounter: Payer: Self-pay | Admitting: Urology

## 2019-10-11 VITALS — BP 144/87 | HR 66 | Ht 73.0 in | Wt 225.0 lb

## 2019-10-11 DIAGNOSIS — N200 Calculus of kidney: Secondary | ICD-10-CM | POA: Diagnosis not present

## 2019-10-11 LAB — BLADDER SCAN AMB NON-IMAGING: Scan Result: 0

## 2019-10-11 NOTE — Patient Instructions (Signed)

## 2019-10-11 NOTE — Progress Notes (Signed)
10/11/2019 8:21 PM   Seth Stone Nov 30, 1970 [id number]  Referring provider: Crecencio Mc, MD [place],  [place] [address]  Chief Complaint  Patient presents with  . Nephrolithiasis    HPI: Seth Stone is a 49 year old male who is status post ESWL for a left renal stone.  He underwent ESWL on 09/27/2019 with 4 mm left renal stone.  His post operative course was as expected and uneventful.    He has brought a stone for analysis.  KUB 10/11/2019: Stone no longer visible.     PMH: Past Medical History:  Diagnosis Date  . GERD (gastroesophageal reflux disease)   . Kidney stone   . Urolithiasis     Surgical History: Past Surgical History:  Procedure Laterality Date  . CERVICAL DISC ARTHROPLASTY     C6/7  . EXTRACORPOREAL SHOCK WAVE LITHOTRIPSY Left 09/27/2019   Procedure: EXTRACORPOREAL SHOCK WAVE LITHOTRIPSY (ESWL);  Surgeon: Billey Co, MD;  Location: [facility] ORS;  Service: Urology;  Laterality: Left;  . hemorrhiordectomy  2012  . LITHOTRIPSY  2017  . NASAL SEPTUM SURGERY  2014  . NASAL SINUS SURGERY  2016  . URETEROSCOPY      Home Medications:  Allergies as of 10/11/2019   No Known Allergies     Medication List       Accurate as of Oct 11, 2019 11:59 PM. If you have any questions, ask your nurse or doctor.        PriLOSEC OTC 20 MG tablet Generic drug: omeprazole Take by mouth.   tamsulosin 0.4 MG Caps capsule Commonly known as: FLOMAX Take 1 capsule (0.4 mg total) by mouth daily after supper.       Allergies: No Known Allergies  Family History: Family History  Problem Relation Age of Onset  . Diabetes Mother   . Alcohol abuse Father   . Arthritis Father   . Hypertension Father   . Emphysema Sister   . Colon cancer Maternal Grandmother   . Stroke Maternal Grandmother     Social History:  reports that he has never smoked. He has never used smokeless tobacco. He reports current alcohol use. He reports  that he does not use drugs.  ROS: Pertinent ROS in HPI  Physical Exam: BP (!) 144/87   Pulse 66   Ht 6\' 1"  (1.854 m)   Wt 225 lb (102.1 kg)   BMI 29.69 kg/m   Constitutional:  Well nourished. Alert and oriented, No acute distress. HEENT: [place] AT, mask in place.  Trachea midline, Cardiovascular: No clubbing, cyanosis, or edema. Respiratory: Normal respiratory effort, no increased work of breathing. Neurologic: Grossly intact, no focal deficits, moving all 4 extremities. Psychiatric: Normal mood and affect.  Laboratory Data: Lab Results  Component Value Date   WBC 4.9 08/08/2019   HGB 15.8 08/08/2019   HCT 46.1 08/08/2019   MCV 90.9 08/08/2019   PLT 189 08/08/2019    Lab Results  Component Value Date   CREATININE 1.12 08/08/2019    Lab Results  Component Value Date   TSH 1.45 11/17/2016       Component Value Date/Time   CHOL 154 03/20/2018 0909   HDL 47.50 03/20/2018 0909   CHOLHDL 3 03/20/2018 0909   VLDL 31.6 03/20/2018 0909   LDLCALC 75 03/20/2018 0909    Lab Results  Component Value Date   AST 24 03/20/2018   Lab Results  Component Value Date   ALT 36 03/20/2018  Urinalysis    Component Value Date/Time   COLORURINE AMBER (A) 09/18/2019 0825   APPEARANCEUR CLEAR 09/18/2019 0825   LABSPEC 1.025 09/18/2019 0825   PHURINE 6.0 09/18/2019 0825   GLUCOSEU NEGATIVE 09/18/2019 0825   HGBUR TRACE (A) 09/18/2019 0825   BILIRUBINUR SMALL (A) 09/18/2019 0825   KETONESUR TRACE (A) 09/18/2019 0825   PROTEINUR 30 (A) 09/18/2019 0825   NITRITE NEGATIVE 09/18/2019 0825   LEUKOCYTESUR NEGATIVE 09/18/2019 0825    I have reviewed the labs.   Pertinent Imaging: CLINICAL DATA:  History of nephrolithiasis. Status post recent lithotripsy  EXAM: ABDOMEN - 1 VIEW  COMPARISON:  Abdominal radiograph Sep 27, 2019; CT abdomen and pelvis August 08, 2018  FINDINGS: No abnormal calcifications appreciable currently. Moderate stool noted in colon. No bowel  dilatation or air-fluid level to suggest bowel obstruction. No free air.  IMPRESSION: No abnormal calcifications. No bowel obstruction or free air evident.   Electronically Signed   By: Lowella Grip III M.D.   On: 10/12/2019 08:22 I have independently reviewed the films.  See HPI.   Assessment & Plan:   1. Left renal stone Status post ESWL - doing well Stone no longer/still visible on KUB Fragments sent for analysis  Obtain RUS to rule out iatrogenic hydronephrosis RTC in one month for report - US RENAL; Future - patient is also interested in pursuing 24 hour metabolic work up  Return for [employer] and 24 hour metabolic work up .  These notes generated with voice recognition software. I apologize for typographical errors.  Zara Council, PA-C  [facility] [address]  [place], [place] [address] [phone]

## 2019-10-17 ENCOUNTER — Other Ambulatory Visit: Payer: Self-pay | Admitting: Urology

## 2019-11-13 ENCOUNTER — Ambulatory Visit: Payer: 59 | Attending: Urology

## 2019-11-15 ENCOUNTER — Telehealth: Payer: Self-pay | Admitting: Urology

## 2019-11-15 NOTE — Telephone Encounter (Signed)
Please call Mr. Seth Stone and have him reschedule his missed renal ultrasound appointment.  It is important that he undergoes the ultrasound, so that we can evaluate his kidney to make sure it has healed.

## 2019-11-16 NOTE — Telephone Encounter (Signed)
Transferred patient to radiology scheduling department to reschedule renal ultrasound.

## 2019-11-21 ENCOUNTER — Ambulatory Visit: Payer: 59 | Attending: Urology

## 2020-04-15 ENCOUNTER — Encounter: Payer: Self-pay | Admitting: Urology

## 2020-04-15 NOTE — Telephone Encounter (Signed)
Seth Stone you last seen this patient 10/11/19 and he was supposed to follow-up with [place] and he didn't, is there anything I can give him for this physical?

## 2020-07-24 ENCOUNTER — Other Ambulatory Visit: Payer: Self-pay

## 2020-07-24 ENCOUNTER — Ambulatory Visit: Payer: 59 | Admitting: Internal Medicine

## 2020-07-24 ENCOUNTER — Encounter: Payer: Self-pay | Admitting: Internal Medicine

## 2020-07-24 ENCOUNTER — Ambulatory Visit
Admission: RE | Admit: 2020-07-24 | Discharge: 2020-07-24 | Disposition: A | Discharge status: Home / Self Care | Payer: 59 | Source: Ambulatory Visit | Attending: Internal Medicine | Admitting: Internal Medicine

## 2020-07-24 ENCOUNTER — Telehealth: Payer: Self-pay | Admitting: Internal Medicine

## 2020-07-24 VITALS — BP 118/76 | HR 78 | Temp 97.9°F | Resp 14 | Ht 73.0 in | Wt 225.6 lb

## 2020-07-24 DIAGNOSIS — E782 Mixed hyperlipidemia: Secondary | ICD-10-CM | POA: Diagnosis not present

## 2020-07-24 DIAGNOSIS — M79672 Pain in left foot: Secondary | ICD-10-CM | POA: Insufficient documentation

## 2020-07-24 DIAGNOSIS — M7662 Achilles tendinitis, left leg: Secondary | ICD-10-CM | POA: Insufficient documentation

## 2020-07-24 DIAGNOSIS — Z87442 Personal history of urinary calculi: Secondary | ICD-10-CM | POA: Insufficient documentation

## 2020-07-24 DIAGNOSIS — I700 Atherosclerosis of aorta: Secondary | ICD-10-CM

## 2020-07-24 LAB — LIPID PANEL
Cholesterol: 138 mg/dL (ref 0–200)
HDL: 45.7 mg/dL (ref >39.00)
LDL Cholesterol: 69 mg/dL (ref 0–99)
NonHDL: 92.78
Total CHOL/HDL Ratio: 3
Triglycerides: 121 mg/dL (ref 0.0–149.0)
VLDL: 24.2 mg/dL (ref 0.0–40.0)

## 2020-07-24 LAB — COMPREHENSIVE METABOLIC PANEL
ALT: 22 U/L (ref 0–53)
AST: 18 U/L (ref 0–37)
Albumin: 4.1 g/dL (ref 3.5–5.2)
Alkaline Phosphatase: 78 U/L (ref 39–117)
BUN: 14 mg/dL (ref 6–23)
CO2: 28 mEq/L (ref 19–32)
Calcium: 9.5 mg/dL (ref 8.4–10.5)
Chloride: 104 mEq/L (ref 96–112)
Creatinine, Ser: 0.97 mg/dL (ref 0.40–1.50)
GFR: 91.69 mL/min (ref >60.00)
Glucose, Bld: 75 mg/dL (ref 70–99)
Potassium: 4.4 mEq/L (ref 3.5–5.1)
Sodium: 139 mEq/L (ref 135–145)
Total Bilirubin: 1.4 mg/dL — ABNORMAL HIGH (ref 0.2–1.2)
Total Protein: 7 g/dL (ref 6.0–8.3)

## 2020-07-24 LAB — SEDIMENTATION RATE: Sed Rate: 14 mm/hr (ref 0–15)

## 2020-07-24 LAB — URIC ACID: Uric Acid, Serum: 6.5 mg/dL (ref 4.0–7.8)

## 2020-07-24 LAB — LDL CHOLESTEROL, DIRECT: Direct LDL: 82 mg/dL

## 2020-07-24 LAB — C-REACTIVE PROTEIN: CRP: <1 mg/dL (ref 0.5–20.0)

## 2020-07-24 MED ORDER — CELECOXIB 200 MG PO CAPS: 200.0000 mg | ORAL_CAPSULE | Freq: Two times a day (BID) | ORAL | 0 refills | Status: DC

## 2020-07-24 MED ORDER — SIMVASTATIN 20 MG PO TABS: 20.0000 mg | ORAL_TABLET | Freq: Every day | ORAL | 3 refills | Status: DC

## 2020-07-24 NOTE — Assessment & Plan Note (Signed)
Based on history and occurrence after resuming exercise the day before. Screening labs to rule out other causes of inflammation  celebrex 200 mg bid for 7 to 14 days,  Tylenol,  frequent icing and   actitivity modification.  Plain film to look for underlying bone spurs . Refer to ortho/podiatry if no improvement in 2 weeks.

## 2020-07-24 NOTE — Telephone Encounter (Signed)
Noted  

## 2020-07-24 NOTE — Progress Notes (Signed)
Subjective:  Patient ID: Seth Stone, male    DOB: May 25, 1970  Age: 50 y.o. MRN: [id number]  CC: The primary encounter diagnosis was Pain of left heel. Diagnoses of History of nephrolithiasis, Mixed hyperlipidemia, Aortic atherosclerosis ([place]), and Tendonitis, Achilles, left were also pertinent to this visit.  HPI [place] presents for evaluation of tender swollen achilles  Heel .  Patient last seen in 2022   This visit occurred during the SARS-CoV-2 public health emergency.  Safety protocols were in place, including screening questions prior to the visit, additional usage of staff PPE, and extensive cleaning of exam room while observing appropriate contact time as indicated for disinfecting solutions.   Cc:  Pain and redness lateral side of left heal and the of achilles started 3 days ago and has become progressively worse.  Started on Monday after working out for the first time in 4 months.  spend ten minutes on the ellipitical machine for his warm up,  Followed by lifting weights , including doing squats.  No pain during workout ,  But same evening noted stiffness and by next morning had mid pain.  Worked out the following morning and pain became worse.  Stopped working out,  [employer],  No change  Took 600 mg motrin today. Pain is severe  Area has become red and swollen and tender to touch  Has not been able to wear a closed shoe  Today .  He is a Academic librarian     Outpatient Medications Prior to Visit  Medication Sig Dispense Refill  . omeprazole (PRILOSEC OTC) 20 MG tablet Take 20 mg by mouth as needed.    . tamsulosin (FLOMAX) 0.4 MG CAPS capsule Take 1 capsule (0.4 mg total) by mouth daily after supper. 10 capsule 0   Facility-Administered Medications Prior to Visit  Medication Dose Route Frequency Provider Last Rate Last Admin  . albuterol (PROVENTIL) (2.5 MG/3ML) 0.083% nebulizer solution 2.5 mg  2.5 mg Nebulization Once Laverle Hobby, MD        Review of  Systems;  Patient denies headache, fevers, malaise, unintentional weight loss, skin rash, eye pain, sinus congestion and sinus pain, sore throat, dysphagia,  hemoptysis , cough, dyspnea, wheezing, chest pain, palpitations, orthopnea, edema, abdominal pain, nausea, melena, diarrhea, constipation, flank pain, dysuria, hematuria, urinary  Frequency, nocturia, numbness, tingling, seizures,  Focal weakness, Loss of consciousness,  Tremor, insomnia, depression, anxiety, and suicidal ideation.      Objective:  BP 118/76 (BP Location: Left Arm, Patient Position: Sitting, Cuff Size: Large)   Pulse 78   Temp 97.9 F (36.6 C) (Oral)   Resp 14   Ht 6\' 1"  (1.854 m)   Wt 225 lb 9.6 oz (102.3 kg)   SpO2 97%   BMI 29.76 kg/m   BP Readings from Last 3 Encounters:  07/24/20 118/76  10/11/19 (!) 144/87  09/27/19 133/82    Wt Readings from Last 3 Encounters:  07/24/20 225 lb 9.6 oz (102.3 kg)  10/11/19 225 lb (102.1 kg)  09/26/19 230 lb 3.2 oz (104.4 kg)    General appearance: alert, cooperative and appears stated age Ears: normal TM's and external ear canals both ears Throat: lips, mucosa, and tongue normal; teeth and gums normal Neck: no adenopathy, no carotid bruit, supple, symmetrical, trachea midline and thyroid not enlarged, symmetric, no tenderness/mass/nodules Back: symmetric, no curvature. ROM normal. No CVA tenderness. Lungs: clear to auscultation bilaterally Heart: regular rate and rhythm, S1, S2 normal, no murmur, click, rub  or gallop Abdomen: soft, non-tender; bowel sounds normal; no masses,  no organomegaly Pulses: 2+ and symmetric Skin: Skin color, texture, turgor normal. No rashes or lesions Lymph nodes: Cervical, supraclavicular, and axillary nodes normal.  No results found for: HGBA1C  Lab Results  Component Value Date   CREATININE 1.12 08/08/2019   CREATININE 1.03 03/20/2018   CREATININE 1.01 07/13/2017    Lab Results  Component Value Date   WBC 4.9 08/08/2019    HGB 15.8 08/08/2019   HCT 46.1 08/08/2019   PLT 189 08/08/2019   GLUCOSE 91 08/08/2019   CHOL 154 03/20/2018   TRIG 158.0 (H) 03/20/2018   HDL 47.50 03/20/2018   LDLCALC 75 03/20/2018   ALT 36 03/20/2018   AST 24 03/20/2018   NA 135 08/08/2019   K 4.1 08/08/2019   CL 101 08/08/2019   CREATININE 1.12 08/08/2019   BUN 11 08/08/2019   CO2 29 08/08/2019   TSH 1.45 11/17/2016   MICROALBUR 0.9 07/13/2017    Abdomen 1 view (KUB)  Result Date: 10/12/2019 CLINICAL DATA:  History of nephrolithiasis. Status post recent lithotripsy EXAM: ABDOMEN - 1 VIEW COMPARISON:  Abdominal radiograph Sep 27, 2019; CT abdomen and pelvis August 08, 2018 FINDINGS: No abnormal calcifications appreciable currently. Moderate stool noted in colon. No bowel dilatation or air-fluid level to suggest bowel obstruction. No free air. IMPRESSION: No abnormal calcifications. No bowel obstruction or free air evident. Electronically Signed   By: Lowella Grip III M.D.   On: 10/12/2019 08:22    Assessment & Plan:   Problem List Items Addressed This Visit      Unprioritized   Aortic atherosclerosis ([place])    Aortic atherosclerosis :  Discussed need for statin therapy despite his excellent lipid panel ,  given documented evidence of moderate  atherosclerosis in the aorta noted on prior renal CT and the prognostic implications of this finding. He is willing to initiate therapy with statin.  Simvastatin 20 mg sent to pharmacy      Relevant Medications   simvastatin (ZOCOR) 20 MG tablet   History of nephrolithiasis   Tendonitis, Achilles, left    Based on history and occurrence after resuming exercise the day before. Screening labs to rule out other causes of inflammation  celebrex 200 mg bid for 7 to 14 days,  Tylenol,  frequent icing and   actitivity modification.  Plain film to look for underlying bone spurs . Refer to ortho/podiatry if no improvement in 2 weeks.        Other Visit Diagnoses    Pain of left heel    -   Primary   Relevant Orders   DG Foot Complete Left   Uric acid   Sedimentation rate   C-reactive protein   Mixed hyperlipidemia       Relevant Medications   simvastatin (ZOCOR) 20 MG tablet   Other Relevant Orders   Comprehensive metabolic panel   Lipid panel   Direct LDL      I have discontinued Woodard A. Modica's tamsulosin. I am also having him start on simvastatin and celecoxib. Additionally, I am having him maintain his omeprazole.  Meds ordered this encounter  Medications  . simvastatin (ZOCOR) 20 MG tablet    Sig: Take 1 tablet (20 mg total) by mouth at bedtime.    Dispense:  90 tablet    Refill:  3  . celecoxib (CELEBREX) 200 MG capsule    Sig: Take 1 capsule (200 mg total) by mouth 2 (  two) times daily.    Dispense:  60 capsule    Refill:  0    Medications Discontinued During This Encounter  Medication Reason  . tamsulosin (FLOMAX) 0.4 MG CAPS capsule     Follow-up: Return in about 3 weeks (around 08/14/2020).   Crecencio Mc, MD

## 2020-07-24 NOTE — Patient Instructions (Addendum)
Your aortic atherosclerosis places you at increased risk for heart attack and stroke, regardless of your lipid panel  I recommend that you start a trial of Zocor  Return in 3 weeks   for follow up liver enzymes   Your achilles tendon is inflamed (tendonitis)   celebrex  200 mg twice daily for 7  to 14 days,  Then reduce to once daily   You can add up to 2000 mg of acetominophen (tylenol) every day safely  In divided doses (500 mg every 6 hours  Or 1000 mg every 12 hours.)  ACTIVITY MODIFICATION  ICE FOR 15 MINUTES EVERY 4 HOURS   Labs here today .  X rays at [facility]   Achilles Tendinitis  Achilles tendinitis is inflammation of the tough, cord-like band that attaches the lower leg muscles to the heel bone (Achilles tendon). This is usually caused by overusing the tendon and the ankle joint. Achilles tendinitis usually gets better over time with treatment and caring for yourself at home. It can take weeks or months to heal completely. What are the causes? This condition may be caused by:  A sudden increase in exercise or activity, such as running.  Doing the same exercises or activities, such as jumping, over and over.  Not warming up calf muscles before exercising.  Exercising in shoes that are worn out or not made for exercise.  Having arthritis or a bone growth (spur) on the back of the heel bone. This can rub against the tendon and hurt it.  Age-related wear and tear. Tendons become less flexible with age and are more likely to be injured. What are the signs or symptoms? Common symptoms of this condition include:  Pain in the Achilles tendon or in the back of the leg, just above the heel. The pain usually gets worse with exercise.  Stiffness or soreness in the back of the leg, especially in the morning.  Swelling of the skin over the Achilles tendon.  Thickening of the tendon.  Trouble standing on tiptoe. How is this diagnosed? This condition is diagnosed based on  your symptoms and a physical exam. You may have tests, including:  X-rays.  MRI. How is this treated? The goal of treatment is to relieve symptoms and help your injury heal. Treatment may include:  Decreasing or stopping activities that caused the tendinitis. This may mean switching to low-impact exercises like biking or swimming.  Icing the injured area.  Doing physical therapy, including strengthening and stretching exercises.  Taking NSAIDs, such as ibuprofen, to help relieve pain and swelling.  Using supportive shoes, wraps, heel lifts, or a walking boot (air cast).  Having surgery. This may be done if your symptoms do not improve after other treatments.  Using high-energy shock wave impulses to stimulate the healing process (extracorporeal shock wave therapy). This is rare.  Having an injection of medicines that help relieve inflammation (corticosteroids). This is rare. Follow these instructions at home: If you have an air cast:  Wear the air cast as told by your health care provider. Remove it only as told by your health care provider.  Loosen it if your toes tingle, become numb, or turn cold and blue.  Keep it clean.  If the air cast is not waterproof: ? Do not let it get wet. ? Cover it with a watertight covering when you take a bath or shower. Managing pain, stiffness, and swelling  If directed, put ice on the injured area. To do this: ?  If you have a removable air cast, remove it as told by your health care provider. ? Put ice in a plastic bag. ? Place a towel between your skin and the bag. ? Leave the ice on for 20 minutes, 2-3 times a day.  Move your toes often to reduce stiffness and swelling.  Raise (elevate) your foot above the level of your heart while you are sitting or lying down.   Activity  Gradually return to your normal activities as told by your health care provider. Ask your health care provider what activities are safe for you.  Do not do  activities that cause pain.  Consider doing low-impact exercises, like cycling or swimming.  Ask your health care provider when it is safe to drive if you have an air cast on your foot.  If physical therapy was prescribed, do exercises as told by your health care provider or physical therapist. General instructions  If directed, wrap your foot with an elastic bandage or other wrap. This can help to keep your tendon from moving too much while it heals. Your health care provider will show you how to wrap your foot correctly.  Wear supportive shoes or heel lifts only as told by your health care provider.  Take over-the-counter and prescription medicines only as told by your health care provider.  Keep all follow-up visits as told by your health care provider. This is important. Contact a health care provider if you:  Have symptoms that get worse.  Have pain that does not get better with medicine.  Develop new, unexplained symptoms.  Develop warmth and swelling in your foot.  Have a fever. Get help right away if you:  Have a sudden popping sound or sensation in your Achilles tendon followed by severe pain.  Cannot move your toes or foot.  Cannot put any weight on your foot.  Your foot or toes become numb and look white or blue even after loosening your bandage or air cast. Summary  Achilles tendinitis is inflammation of the tough, cord-like band that attaches the lower leg muscles to the heel bone (Achilles tendon).  This condition is usually caused by overusing the tendon and the ankle joint. It can also be caused by arthritis or normal aging.  The most common symptoms of this condition include pain, swelling, or stiffness in the Achilles tendon or in the back of the leg.  This condition is usually treated by decreasing or stopping activities that caused the tendinitis, icing the injured area, taking NSAIDs, and doing physical therapy. This information is not intended to  replace advice given to you by your health care provider. Make sure you discuss any questions you have with your health care provider. Document Revised: 09/25/2018 Document Reviewed: 09/25/2018 Elsevier Patient Education  [employer].

## 2020-07-24 NOTE — Assessment & Plan Note (Signed)
Aortic atherosclerosis :  Discussed need for statin therapy despite his excellent lipid panel ,  given documented evidence of moderate  atherosclerosis in the aorta noted on prior renal CT and the prognostic implications of this finding. He is willing to initiate therapy with statin.  Simvastatin 20 mg sent to pharmacy

## 2020-07-24 NOTE — Telephone Encounter (Signed)
Patient was put on schedule today for 12 appt

## 2020-08-12 ENCOUNTER — Telehealth: Payer: Self-pay | Admitting: *Deleted

## 2020-08-12 DIAGNOSIS — E785 Hyperlipidemia, unspecified: Secondary | ICD-10-CM

## 2020-08-12 NOTE — Telephone Encounter (Signed)
Please place future orders for lab appt.  

## 2020-08-13 ENCOUNTER — Other Ambulatory Visit: Payer: 59

## 2020-08-16 ENCOUNTER — Other Ambulatory Visit: Payer: Self-pay | Admitting: Internal Medicine

## 2020-08-21 ENCOUNTER — Other Ambulatory Visit: Payer: Self-pay

## 2020-08-21 ENCOUNTER — Other Ambulatory Visit (INDEPENDENT_AMBULATORY_CARE_PROVIDER_SITE_OTHER): Payer: 59

## 2020-08-21 DIAGNOSIS — E785 Hyperlipidemia, unspecified: Secondary | ICD-10-CM | POA: Diagnosis not present

## 2020-08-22 ENCOUNTER — Ambulatory Visit: Payer: 59 | Admitting: Internal Medicine

## 2020-08-22 ENCOUNTER — Telehealth: Payer: Self-pay | Admitting: Internal Medicine

## 2020-08-22 ENCOUNTER — Other Ambulatory Visit: Payer: Self-pay

## 2020-08-22 ENCOUNTER — Encounter: Payer: Self-pay | Admitting: Internal Medicine

## 2020-08-22 DIAGNOSIS — M7662 Achilles tendinitis, left leg: Secondary | ICD-10-CM | POA: Diagnosis not present

## 2020-08-22 DIAGNOSIS — J301 Allergic rhinitis due to pollen: Secondary | ICD-10-CM

## 2020-08-22 DIAGNOSIS — M25512 Pain in left shoulder: Secondary | ICD-10-CM

## 2020-08-22 LAB — COMPREHENSIVE METABOLIC PANEL
ALT: 24 U/L (ref 0–53)
AST: 16 U/L (ref 0–37)
Albumin: 4.2 g/dL (ref 3.5–5.2)
Alkaline Phosphatase: 75 U/L (ref 39–117)
BUN: 17 mg/dL (ref 6–23)
CO2: 29 mEq/L (ref 19–32)
Calcium: 9.3 mg/dL (ref 8.4–10.5)
Chloride: 104 mEq/L (ref 96–112)
Creatinine, Ser: 1.01 mg/dL (ref 0.40–1.50)
GFR: 87.3 mL/min (ref >60.00)
Glucose, Bld: 89 mg/dL (ref 70–99)
Potassium: 4.2 mEq/L (ref 3.5–5.1)
Sodium: 140 mEq/L (ref 135–145)
Total Bilirubin: 0.9 mg/dL (ref 0.2–1.2)
Total Protein: 6.9 g/dL (ref 6.0–8.3)

## 2020-08-22 MED ORDER — CELECOXIB 200 MG PO CAPS: 200.0000 mg | ORAL_CAPSULE | Freq: Two times a day (BID) | ORAL | 0 refills | Status: DC

## 2020-08-22 NOTE — Telephone Encounter (Signed)
This was refilled during visit and someone cancelled it. . I have refilled it

## 2020-08-22 NOTE — Patient Instructions (Addendum)
For your allergies:  Consider daily use of  Allegra .  It is available  generically as fexofenadine and it comes in 180 mg once daily strengths.  It can be taken in the morning  And evening or jut once daily if not needed twice daily  For your new shoulder injury:  Resume  celebrex twice daily  For the next 2 weeks for the rotator cuff sprain ,  Then reduce dose to once daily for 2 more weeks to allow everything to heal  ROM exercises (circles,  Walking your fingers up the wall) several times daily to prevent frozen shoulder  To prevent gastritis:  Resume omeprazole daily for the next month.  Once you are done  taking a daily NSAID you can stop omeprazole and use generic pepcid  (whic Is available as  Famotidine  20 mg , once or twice daily )   You can take your cholesterol medication in the morning if you can remember this better!

## 2020-08-22 NOTE — Telephone Encounter (Signed)
Patient called in for refill on Celebrex

## 2020-08-22 NOTE — Progress Notes (Signed)
Subjective:  Patient ID: Seth Stone, male    DOB: [birth year]  Age: 50 y.o. MRN: [id number]  CC: Diagnoses of Tendonitis, Achilles, left, Acute pain of left shoulder, and Seasonal allergic rhinitis due to pollen were pertinent to this visit.  HPI [place] presents for  3 week follow up on heel pain and hyperlipidemia.  This visit occurred during the SARS-CoV-2 public health emergency.  Safety protocols were in place, including screening questions prior to the visit, additional usage of staff PPE, and extensive cleaning of exam room while observing appropriate contact time as indicated for disinfecting solutions.   Heel pain : tentative diagnosis: achilles tendonopathy  Based on history of recent exercise,   exam and negative foot films for fracture with S/T edema noted on films  Patient was started on celebrex bid and advised to modify activities.  He is having significantly less pain and is able to wear shoes without pain.   However, he recently had an unintentional slide on wet grass and forcibly flexed his left shoulder when he grabbed a railing to avoid  falling.  He has been having pain in the suprascapular region with full extension of the shoulder but denies numbness , tingling and loss of strength  .  Hyperlidiemia;  He WAS Advised to start statin therapy with zocor and is tolerating the medication without myalgias  3) having increased congestion, rhinitis and sneezing, for the last several weeks worse in the morning   Outpatient Medications Prior to Visit  Medication Sig Dispense Refill  . omeprazole (PRILOSEC OTC) 20 MG tablet Take 20 mg by mouth as needed.    . simvastatin (ZOCOR) 20 MG tablet Take 1 tablet (20 mg total) by mouth at bedtime. 90 tablet 3  . celecoxib (CELEBREX) 200 MG capsule TAKE 1 CAPSULE BY MOUTH TWICE A DAY (Patient not taking: Reported on 08/22/2020) 60 capsule 0   Facility-Administered Medications Prior to Visit  Medication Dose Route  Frequency Provider Last Rate Last Admin  . albuterol (PROVENTIL) (2.5 MG/3ML) 0.083% nebulizer solution 2.5 mg  2.5 mg Nebulization Once Laverle Hobby, MD        Review of Systems;  Patient denies headache, fevers, malaise, unintentional weight loss, skin rash, eye pain, sinus congestion and sinus pain, sore throat, dysphagia,  hemoptysis , cough, dyspnea, wheezing, chest pain, palpitations, orthopnea, edema, abdominal pain, nausea, melena, diarrhea, constipation, flank pain, dysuria, hematuria, urinary  Frequency, nocturia, numbness, tingling, seizures,  Focal weakness, Loss of consciousness,  Tremor, insomnia, depression, anxiety, and suicidal ideation.      Objective:  BP 112/76 (BP Location: Left Arm, Patient Position: Sitting, Cuff Size: Large)   Pulse 64   Temp 98.1 F (36.7 C) (Oral)   Resp 14   Ht 6\' 1"  (1.854 m)   Wt 229 lb (103.9 kg)   SpO2 97%   BMI 30.21 kg/m   BP Readings from Last 3 Encounters:  08/22/20 112/76  07/24/20 118/76  10/11/19 (!) 144/87    Wt Readings from Last 3 Encounters:  08/22/20 229 lb (103.9 kg)  07/24/20 225 lb 9.6 oz (102.3 kg)  10/11/19 225 lb (102.1 kg)    General appearance: alert, cooperative and appears stated age Ears: normal TM's and external ear canals both ears Throat: lips, mucosa, and tongue normal; teeth and gums normal Neck: no adenopathy, no carotid bruit, supple, symmetrical, trachea midline and thyroid not enlarged, symmetric, no tenderness/mass/nodules Back: symmetric, no curvature. ROM normal. No CVA tenderness. Lungs: clear to  auscultation bilaterally Heart: regular rate and rhythm, S1, S2 normal, no murmur, click, rub or gallop Abdomen: soft, non-tender; bowel sounds normal; no masses,  no organomegaly Pulses: 2+ and symmetric Skin: Skin color, texture, turgor normal. No rashes or lesions Lymph nodes: Cervical, supraclavicular, and axillary nodes normal.  No results found for: HGBA1C  Lab Results   Component Value Date   CREATININE 1.01 08/21/2020   CREATININE 0.97 07/24/2020   CREATININE 1.12 08/08/2019    Lab Results  Component Value Date   WBC 4.9 08/08/2019   HGB 15.8 08/08/2019   HCT 46.1 08/08/2019   PLT 189 08/08/2019   GLUCOSE 89 08/21/2020   CHOL 138 07/24/2020   TRIG 121.0 07/24/2020   HDL 45.70 07/24/2020   LDLDIRECT 82.0 07/24/2020   LDLCALC 69 07/24/2020   ALT 24 08/21/2020   AST 16 08/21/2020   NA 140 08/21/2020   K 4.2 08/21/2020   CL 104 08/21/2020   CREATININE 1.01 08/21/2020   BUN 17 08/21/2020   CO2 29 08/21/2020   TSH 1.45 11/17/2016   MICROALBUR 0.9 07/13/2017    DG Foot Complete Left  Result Date: 07/25/2020 CLINICAL DATA:  Sudden onset of pain and redness at the base of the Achilles tendon and lateral foot after activity EXAM: LEFT FOOT - COMPLETE 3+ VIEW COMPARISON:  None. FINDINGS: No fracture or dislocation of the left foot. Joint spaces are well preserved. Soft tissue edema of the heel. IMPRESSION: No fracture or dislocation of the left foot. Joint spaces are well preserved. Soft tissue edema of the heel. Electronically Signed   By: Eddie Candle M.D.   On: 07/25/2020 14:09    Assessment & Plan:   Problem List Items Addressed This Visit      Unprioritized   Acute pain of left shoulder    Rotator cuff muscle sprain/strain suspected based on history and exam.  Resume celebrex bid.  ROM exercises to prevent frozen shoulder.      Allergic rhinitis     seasonal allergies suspected based on chronicity.  advised to try  allegra (fexofenadine 180 mg daily),      Tendonitis, Achilles, left    Improving with NSAID and activity modification         I have discontinued Jenesis A. Luther's celecoxib. I am also having him maintain his omeprazole and simvastatin.  No orders of the defined types were placed in this encounter.   Medications Discontinued During This Encounter  Medication Reason  . celecoxib (CELEBREX) 200 MG capsule      Follow-up: Return in about 6 months (around 02/21/2021).   Crecencio Mc, MD

## 2020-08-24 ENCOUNTER — Encounter: Payer: Self-pay | Admitting: Internal Medicine

## 2020-08-24 DIAGNOSIS — J309 Allergic rhinitis, unspecified: Secondary | ICD-10-CM | POA: Insufficient documentation

## 2020-08-24 DIAGNOSIS — M25512 Pain in left shoulder: Secondary | ICD-10-CM | POA: Insufficient documentation

## 2020-08-24 NOTE — Assessment & Plan Note (Signed)
Rotator cuff muscle sprain/strain suspected based on history and exam.  Resume celebrex bid.  ROM exercises to prevent frozen shoulder.

## 2020-08-24 NOTE — Assessment & Plan Note (Signed)
seasonal allergies suspected based on chronicity.  advised to try  allegra (fexofenadine 180 mg daily),

## 2020-08-24 NOTE — Assessment & Plan Note (Signed)
Improving with NSAID and activity modification

## 2020-09-21 ENCOUNTER — Other Ambulatory Visit: Payer: Self-pay | Admitting: Internal Medicine

## 2020-10-17 ENCOUNTER — Encounter: Payer: Self-pay | Admitting: Emergency Medicine

## 2020-10-17 ENCOUNTER — Telehealth: Payer: 59 | Admitting: Emergency Medicine

## 2020-10-17 DIAGNOSIS — J0111 Acute recurrent frontal sinusitis: Secondary | ICD-10-CM | POA: Diagnosis not present

## 2020-10-17 MED ORDER — AMOXICILLIN-POT CLAVULANATE 875-125 MG PO TABS: 1.0000 | ORAL_TABLET | Freq: Two times a day (BID) | ORAL | 0 refills | Status: DC

## 2020-10-17 NOTE — Patient Instructions (Addendum)
   Follow up with primary care next week if not improving. Seek in-person treatment at an urgent care or emergency department should symptoms worsen this evening or over the weekend.

## 2020-10-17 NOTE — Progress Notes (Signed)
Mr. tai, skelly are scheduled for a virtual visit with your provider today.    Just as we do with appointments in the office, we must obtain your consent to participate.  Your consent will be active for this visit and any virtual visit you may have with one of our providers in the next 365 days.    If you have a MyChart account, I can also send a copy of this consent to you electronically.  All virtual visits are billed to your insurance company just like a traditional visit in the office.  As this is a virtual visit, video technology does not allow for your provider to perform a traditional examination.  This may limit your provider's ability to fully assess your condition.  If your provider identifies any concerns that need to be evaluated in person or the need to arrange testing such as labs, EKG, etc, we will make arrangements to do so.    Although advances in technology are sophisticated, we cannot ensure that it will always work on either your end or our end.  If the connection with a video visit is poor, we may have to switch to a telephone visit.  With either a video or telephone visit, we are not always able to ensure that we have a secure connection.   I need to obtain your verbal consent now.   Are you willing to proceed with your visit today?   [facility] has provided verbal consent on 10/17/2020 for a virtual visit (video or telephone).   Noe Gens, PA-C 10/17/2020  2:14 PM   Date:  10/17/2020   ID:  [name], [place] 08/26/1970, MRN [id number]  Patient Location: Home Provider Location: Home Office   Participants: Patient and Provider for Visit and Wrap up  Method of visit: Video  Location of Patient: Home Location of Provider: Home Office Consent was obtain for visit over the video. Services rendered by provider: Visit was performed via video  A video enabled telemedicine application was used and I verified that I am speaking with the correct person using two  identifiers.  PCP:  Crecencio Mc, MD   Chief Complaint:  Nasal congestion, sinus pain and pressure  History of Present Illness:    Rishith Siddoway is a 50 y.o. male with history as stated below. Presents video telehealth for an acute care visit  Onset of symptoms was 5 days ago and symptoms have been persistent and include: frontal headache, nasal discharge- green/yellow, thick mucous.  Bilateral ear pressure, returned from [place] on a plane yesterday, made pressure in his ears worsen.  Associated frontal upper teeth pain.  History of recurrent sinus infections.    Denies having fevers, chills, shortness of breath, cough, chest pain, sore throat or exposure to covid or other sick contacts.   Modifying factors include: Dayquil, Nyquil, Afrin   No other aggravating or relieving factors.  No other c/o.   The patient does not have symptoms concerning for COVID-19 infection (fever, chills, cough, or new shortness of breath).  Patient has been tested for COVID during this illness and tested negative prior to leaving and upon returning from [place].  Past Medical, Surgical, Social History, Allergies, and Medications have been Reviewed.  Patient Active Problem List   Diagnosis Date Noted  . Acute pain of left shoulder 08/24/2020  . Allergic rhinitis 08/24/2020  . History of nephrolithiasis 07/24/2020  . Tendonitis, Achilles, left 07/24/2020  . Cervical disc disorder at C6-C7 level  with radiculopathy 04/12/2018  . Cervical radiculitis 03/29/2018  . Rotator cuff tendinitis, right 03/29/2018  . Aortic atherosclerosis ([place]) 02/19/2018  . Nasal turbinate hypertrophy 04/26/2017  . B12 deficiency 11/19/2016  . Vitamin D deficiency 11/19/2016  . Obesity (BMI 30.0-34.9) 11/19/2016  . Gastroesophageal reflux disease with esophagitis 11/19/2016    Social History   Tobacco Use  . Smoking status: Never Smoker  . Smokeless tobacco: Never Used  Substance Use Topics  . Alcohol use: Yes      Current Outpatient Medications:  .  amoxicillin-clavulanate (AUGMENTIN) 875-125 MG tablet, Take 1 tablet by mouth 2 (two) times daily. One po bid x 7 days, Disp: 14 tablet, Rfl: 0 .  celecoxib (CELEBREX) 200 MG capsule, TAKE 1 CAPSULE BY MOUTH TWICE A DAY, Disp: 60 capsule, Rfl: 0 .  omeprazole (PRILOSEC OTC) 20 MG tablet, Take 20 mg by mouth as needed., Disp: , Rfl:  .  simvastatin (ZOCOR) 20 MG tablet, Take 1 tablet (20 mg total) by mouth at bedtime., Disp: 90 tablet, Rfl: 3 No current facility-administered medications for this visit.  Facility-Administered Medications Ordered in Other Visits:  .  albuterol (PROVENTIL) (2.5 MG/3ML) 0.083% nebulizer solution 2.5 mg, 2.5 mg, Nebulization, Once, Laverle Hobby, MD   No Known Allergies   ROS See HPI for history of present illness.  Physical Exam Constitutional:      General: He is not in acute distress.    Appearance: Normal appearance. He is not ill-appearing, toxic-appearing or diaphoretic.  HENT:     Head:     Comments: Mild bilateral frontal tenderness with palpation per patient.     Nose: Congestion present.     Right Sinus: Frontal sinus tenderness present.     Left Sinus: Frontal sinus tenderness present.  Pulmonary:     Effort: Pulmonary effort is normal. No respiratory distress.  Musculoskeletal:     Cervical back: Normal range of motion.  Neurological:     Mental Status: He is alert.  Psychiatric:        Mood and Affect: Mood normal.        Behavior: Behavior normal.               A&P  1. Recurrent Frontal Sinusitis  -Augmentin 875-125mg  twice daily for 7 days  -may continue Dayquil and Nyquil  -limit use of Afrin, may try sinus rinses instead    Patient voiced understanding and agreement to plan.   Time:   Today, I have spent 15 minutes with the patient with telehealth technology discussing the above problems, reviewing the chart, previous notes, medications and orders.     Tests  Ordered: No orders of the defined types were placed in this encounter.   Medication Changes: Meds ordered this encounter  Medications  . amoxicillin-clavulanate (AUGMENTIN) 875-125 MG tablet    Sig: Take 1 tablet by mouth 2 (two) times daily. One po bid x 7 days    Dispense:  14 tablet    Refill:  0     Disposition:  Follow up with PCP next week if not improving. Seek in-person evaluation at urgent care or emergency department if symptoms worsening this evening or over the holiday weekend.   Etta Grandchild, PA-C  10/17/2020 2:14 PM

## 2021-01-18 ENCOUNTER — Telehealth: Payer: 59 | Admitting: Family

## 2021-01-18 DIAGNOSIS — J019 Acute sinusitis, unspecified: Secondary | ICD-10-CM | POA: Diagnosis not present

## 2021-01-18 MED ORDER — FLUTICASONE PROPIONATE 50 MCG/ACT NA SUSP: 2.0000 | Freq: Every day | NASAL | 6 refills | Status: DC

## 2021-01-18 MED ORDER — AMOXICILLIN-POT CLAVULANATE 875-125 MG PO TABS: 1.0000 | ORAL_TABLET | Freq: Two times a day (BID) | ORAL | 0 refills | Status: DC

## 2021-01-18 NOTE — Progress Notes (Signed)
Virtual Visit Consent   [facility], you are scheduled for a virtual visit with a [facility] provider today.     Just as with appointments in the office, your consent must be obtained to participate.  Your consent will be active for this visit and any virtual visit you may have with one of our providers in the next 365 days.     If you have a MyChart account, a copy of this consent can be sent to you electronically.  All virtual visits are billed to your insurance company just like a traditional visit in the office.    As this is a virtual visit, video technology does not allow for your provider to perform a traditional examination.  This may limit your provider's ability to fully assess your condition.  If your provider identifies any concerns that need to be evaluated in person or the need to arrange testing (such as labs, EKG, etc.), we will make arrangements to do so.     Although advances in technology are sophisticated, we cannot ensure that it will always work on either your end or our end.  If the connection with a video visit is poor, the visit may have to be switched to a telephone visit.  With either a video or telephone visit, we are not always able to ensure that we have a secure connection.     I need to obtain your verbal consent now.   Are you willing to proceed with your visit today?    [facility] has provided verbal consent on 01/18/2021 for a virtual visit (video or telephone).   Evelina Dun, FNP   Date: 01/18/2021 7:59 PM   Virtual Visit via Video Note   I, Evelina Dun, connected with  [place]  ([id number], 12-30-1970) on 01/18/21 at  7:45 PM EDT by a video-enabled telemedicine application and verified that I am speaking with the correct person using two identifiers.  Location: Patient: Virtual Visit Location Patient: Home Provider: Virtual Visit Location Provider: Home   I discussed the limitations of evaluation and management by  telemedicine and the availability of in person appointments. The patient expressed understanding and agreed to proceed.    History of Present Illness: Seth Stone is a 50 y.o. who identifies as a male who was assigned male at birth, and is being seen today for sinus issue. He has has taken three COVID tests that was negative. He reports he is planning on flying tomorrow and is nervous about his ear pain. He states his ear pain is a 4-5 out 10 right now.   HPI: Sinusitis This is a new problem. The current episode started in the past 7 days. The problem has been gradually worsening since onset. Associated symptoms include congestion, coughing, ear pain, headaches, sinus pressure and a sore throat. Pertinent negatives include no shortness of breath or sneezing. Past treatments include acetaminophen. The treatment provided mild relief.   Problems:  Patient Active Problem List   Diagnosis Date Noted   Acute pain of left shoulder 08/24/2020   Allergic rhinitis 08/24/2020   History of nephrolithiasis 07/24/2020   Tendonitis, Achilles, left 07/24/2020   Cervical disc disorder at C6-C7 level with radiculopathy 04/12/2018   Cervical radiculitis 03/29/2018   Rotator cuff tendinitis, right 03/29/2018   Aortic atherosclerosis ([place]) 02/19/2018   Nasal turbinate hypertrophy 04/26/2017   B12 deficiency 11/19/2016   Vitamin D deficiency 11/19/2016   Obesity (BMI 30.0-34.9) 11/19/2016   Gastroesophageal reflux  disease with esophagitis 11/19/2016    Allergies: No Known Allergies Medications:  Current Outpatient Medications:    amoxicillin-clavulanate (AUGMENTIN) 875-125 MG tablet, Take 1 tablet by mouth 2 (two) times daily., Disp: 14 tablet, Rfl: 0   fluticasone (FLONASE) 50 MCG/ACT nasal spray, Place 2 sprays into both nostrils daily., Disp: 16 g, Rfl: 6   omeprazole (PRILOSEC OTC) 20 MG tablet, Take 20 mg by mouth as needed., Disp: , Rfl:    simvastatin (ZOCOR) 20 MG tablet, Take 1 tablet (20 mg  total) by mouth at bedtime., Disp: 90 tablet, Rfl: 3  Observations/Objective: Patient is well-developed, well-nourished in no acute distress.  Resting comfortably  at home.  Head is normocephalic, atraumatic.  No labored breathing. Speech is clear and coherent with logical content.  Patient is alert and oriented at baseline.  Nasal congestion noted   Assessment and Plan: 1. Acute sinusitis, recurrence not specified, unspecified location - fluticasone (FLONASE) 50 MCG/ACT nasal spray; Place 2 sprays into both nostrils daily.  Dispense: 16 g; Refill: 6 - amoxicillin-clavulanate (AUGMENTIN) 875-125 MG tablet; Take 1 tablet by mouth 2 (two) times daily.  Dispense: 14 tablet; Refill: 0 - Take meds as prescribed - Use a cool mist humidifier  -Use saline nose sprays frequently -Force fluids -For any cough or congestion  Use plain Mucinex- regular strength or max strength is fine -For fever or aces or pains- take tylenol or ibuprofen. -Throat lozenges if help  Follow Up Instructions: I discussed the assessment and treatment plan with the patient. The patient was provided an opportunity to ask questions and all were answered. The patient agreed with the plan and demonstrated an understanding of the instructions.  A copy of instructions were sent to the patient via MyChart.  The patient was advised to call back or seek an in-person evaluation if the symptoms worsen or if the condition fails to improve as anticipated.  Time:  I spent 6 minutes with the patient via telehealth technology discussing the above problems/concerns.    Evelina Dun, FNP

## 2021-02-23 ENCOUNTER — Ambulatory Visit: Payer: 59 | Admitting: Internal Medicine

## 2022-01-19 ENCOUNTER — Telehealth: Payer: Self-pay | Admitting: Internal Medicine

## 2022-01-19 NOTE — Telephone Encounter (Signed)
Spoke with pt and scheduled him an appt for next Tuesday because pt has not been seen since 08/2020. Pt is aware of appt date and time.

## 2022-01-19 NOTE — Telephone Encounter (Signed)
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 Pt would like to be called

## 2022-01-26 ENCOUNTER — Ambulatory Visit: Payer: 59 | Admitting: Internal Medicine

## 2022-01-26 ENCOUNTER — Encounter: Payer: Self-pay | Admitting: Internal Medicine

## 2022-01-26 VITALS — BP 126/88 | HR 66 | Temp 98.1°F | Ht 73.0 in | Wt 232.0 lb

## 2022-01-26 DIAGNOSIS — R7301 Impaired fasting glucose: Secondary | ICD-10-CM | POA: Diagnosis not present

## 2022-01-26 DIAGNOSIS — R5383 Other fatigue: Secondary | ICD-10-CM | POA: Diagnosis not present

## 2022-01-26 DIAGNOSIS — H539 Unspecified visual disturbance: Secondary | ICD-10-CM | POA: Diagnosis not present

## 2022-01-26 DIAGNOSIS — I700 Atherosclerosis of aorta: Secondary | ICD-10-CM | POA: Diagnosis not present

## 2022-01-26 DIAGNOSIS — Z1211 Encounter for screening for malignant neoplasm of colon: Secondary | ICD-10-CM

## 2022-01-26 DIAGNOSIS — Z0000 Encounter for general adult medical examination without abnormal findings: Secondary | ICD-10-CM

## 2022-01-26 DIAGNOSIS — Z87442 Personal history of urinary calculi: Secondary | ICD-10-CM

## 2022-01-26 DIAGNOSIS — Z125 Encounter for screening for malignant neoplasm of prostate: Secondary | ICD-10-CM | POA: Diagnosis not present

## 2022-01-26 DIAGNOSIS — E785 Hyperlipidemia, unspecified: Secondary | ICD-10-CM

## 2022-01-26 LAB — CBC WITH DIFFERENTIAL/PLATELET
Basophils Absolute: 0 10*3/uL (ref 0.0–0.1)
Basophils Relative: 0.8 % (ref 0.0–3.0)
Eosinophils Absolute: 0.1 10*3/uL (ref 0.0–0.7)
Eosinophils Relative: 1.5 % (ref 0.0–5.0)
HCT: 45.8 % (ref 39.0–52.0)
Hemoglobin: 15.5 g/dL (ref 13.0–17.0)
Lymphocytes Relative: 36.7 % (ref 12.0–46.0)
Lymphs Abs: 1.8 10*3/uL (ref 0.7–4.0)
MCHC: 33.8 g/dL (ref 30.0–36.0)
MCV: 90.9 fl (ref 78.0–100.0)
Monocytes Absolute: 0.5 10*3/uL (ref 0.1–1.0)
Monocytes Relative: 9.7 % (ref 3.0–12.0)
Neutro Abs: 2.5 10*3/uL (ref 1.4–7.7)
Neutrophils Relative %: 51.3 % (ref 43.0–77.0)
Platelets: 190 10*3/uL (ref 150.0–400.0)
RBC: 5.03 Mil/uL (ref 4.22–5.81)
RDW: 13.1 % (ref 11.5–15.5)
WBC: 4.8 10*3/uL (ref 4.0–10.5)

## 2022-01-26 LAB — COMPREHENSIVE METABOLIC PANEL
ALT: 26 U/L (ref 0–53)
AST: 22 U/L (ref 0–37)
Albumin: 4.1 g/dL (ref 3.5–5.2)
Alkaline Phosphatase: 74 U/L (ref 39–117)
BUN: 15 mg/dL (ref 6–23)
CO2: 28 mEq/L (ref 19–32)
Calcium: 9.5 mg/dL (ref 8.4–10.5)
Chloride: 107 mEq/L (ref 96–112)
Creatinine, Ser: 0.98 mg/dL (ref 0.40–1.50)
GFR: 89.61 mL/min (ref >60.00)
Glucose, Bld: 80 mg/dL (ref 70–99)
Potassium: 4.4 mEq/L (ref 3.5–5.1)
Sodium: 142 mEq/L (ref 135–145)
Total Bilirubin: 1.1 mg/dL (ref 0.2–1.2)
Total Protein: 7.3 g/dL (ref 6.0–8.3)

## 2022-01-26 LAB — LIPID PANEL
Cholesterol: 153 mg/dL (ref 0–200)
HDL: 45.7 mg/dL (ref >39.00)
LDL Cholesterol: 89 mg/dL (ref 0–99)
NonHDL: 106.8
Total CHOL/HDL Ratio: 3
Triglycerides: 89 mg/dL (ref 0.0–149.0)
VLDL: 17.8 mg/dL (ref 0.0–40.0)

## 2022-01-26 LAB — PSA: PSA: 2.4 ng/mL (ref 0.10–4.00)

## 2022-01-26 LAB — HEMOGLOBIN A1C: Hgb A1c MFr Bld: 5.5 % (ref 4.6–6.5)

## 2022-01-26 LAB — TSH: TSH: 1.16 u[IU]/mL (ref 0.35–5.50)

## 2022-01-26 LAB — LDL CHOLESTEROL, DIRECT: Direct LDL: 103 mg/dL

## 2022-01-26 MED ORDER — ZOSTER VAC RECOMB ADJUVANTED 50 MCG/0.5ML IM SUSR: 0.5000 mL | Freq: Once | INTRAMUSCULAR | 1 refills | Status: AC

## 2022-01-26 NOTE — Progress Notes (Signed)
The patient is here for annual preventive examination and management of other chronic and acute problems.  Last visit April 2022    The risk factors are reflected in the social history.   The roster of all physicians providing medical care to patient - is listed in the Snapshot section of the chart.   Activities of daily living:  The patient is 100% independent in all ADLs: dressing, toileting, feeding as well as independent mobility   Home safety : The patient has smoke detectors in the home. They wear seatbelts.  There are no unsecured firearms at home. There is no violence in the home.    There is no risks for hepatitis, STDs or HIV. There is no   history of blood transfusion. They have no travel history to infectious disease endemic areas of the world.   The patient has seen their dentist in the last six month. They have seen their eye doctor in the last year. The patient denies slight hearing difficulty with regard to whispered voices and some television programs.  They have deferred audiologic testing in the last year.  They do not  have excessive sun exposure. Discussed the need for sun protection: hats, long sleeves and use of sunscreen if there is significant sun exposure.    Diet: the importance of a healthy diet is discussed. They do have a healthy diet.   The benefits of regular aerobic exercise were discussed. The patient  exercises  3 to 5 days per week  for  60 minutes.    Depression screen: there are no signs or vegative symptoms of depression- irritability, change in appetite, anhedonia, sadness/tearfullness.   The following portions of the patient's history were reviewed and updated as appropriate: allergies, current medications, past family history, past medical history,  past surgical history, past social history  and problem list.   Visual acuity was not assessed per patient preference since the patient has regular follow up with an  ophthalmologist. Hearing and body mass  index were assessed and reviewed.    During the course of the visit the patient was educated and counseled about appropriate screening and preventive services including : fall prevention , diabetes screening, nutrition counseling, colorectal cancer screening, and recommended immunizations.    Chief Complaint:  Need for annual pilot's physical exam .  He requires proof that he has no recurrence of nephrolithiasis with g CT renal stone study   Recent episode of "floater " in right eye which  lasted approximately 6 months and caused change in vision .  Found  Can move it with rapid eye movement. . Wears nonpolarized glasses History of remote trauma to right eye from racquet ball,  no damage  last eye exam 2 years ago.     Review of Symptoms  Patient denies headache, fevers, malaise, unintentional weight loss, skin rash, eye pain, sinus congestion and sinus pain, sore throat, dysphagia,  hemoptysis , cough, dyspnea, wheezing, chest pain, palpitations, orthopnea, edema, abdominal pain, nausea, melena, diarrhea, constipation, flank pain, dysuria, hematuria, urinary  Frequency, nocturia, numbness, tingling, seizures,  Focal weakness, Loss of consciousness,  Tremor, insomnia, depression, anxiety, and suicidal ideation.    Physical Exam:  BP 126/88 (BP Location: Left Arm, Patient Position: Sitting, Cuff Size: Large)   Pulse 66   Temp 98.1 F (36.7 C) (Oral)   Ht '6\' 1"'$  (1.854 m)   Wt 232 lb (105.2 kg)   SpO2 98%   BMI 30.61 kg/m    General appearance: alert,  cooperative and appears stated age Ears: normal TM's and external ear canals both ears Throat: lips, mucosa, and tongue normal; teeth and gums normal Neck: no adenopathy, no carotid bruit, supple, symmetrical, trachea midline and thyroid not enlarged, symmetric, no tenderness/mass/nodules Back: symmetric, no curvature. ROM normal. No CVA tenderness. Lungs: clear to auscultation bilaterally Heart: regular rate and rhythm, S1, S2 normal, no  murmur, click, rub or gallop Abdomen: soft, non-tender; bowel sounds normal; no masses,  no organomegaly Pulses: 2+ and symmetric Skin: Skin color, texture, turgor normal. No rashes or lesions Lymph nodes: Cervical, supraclavicular, and axillary nodes normal.   Assessment and Plan:  Aortic atherosclerosis ([place]) Has been noncompliant with simvastatin due to forgetting to take it. Discussed cardiology consult for cardiac screening.  Fasting lipids ordered   Encounter for preventive health examination age appropriate education and counseling updated, referrals for preventative services and immunizations addressed, dietary and smoking counseling addressed, most recent labs reviewed.  I have personally reviewed and have noted:   1) the patient's medical and social history 2) The pt's use of alcohol, tobacco, and illicit drugs 3) The patient's current medications and supplements 4) Functional ability including ADL's, fall risk, home safety risk, hearing and visual impairment 5) Diet and physical activities 6) Evidence for depression or mood disorder 7) The patient's height, weight, and BMI have been recorded in the chart    I have made referrals, and provided counseling and education based on review of the above  Change in vision transient involving right eye,  But also noting he needs more light to read.  Recommend annual eye exams and screening for cataracts   Updated Medication List Outpatient Encounter Medications as of 01/26/2022  Medication Sig   fluticasone (FLONASE) 50 MCG/ACT nasal spray Place 2 sprays into both nostrils daily.   omeprazole (PRILOSEC OTC) 20 MG tablet Take 20 mg by mouth as needed.   Zoster Vaccine Adjuvanted [facility]) injection Inject 0.5 mLs into the muscle once for 1 dose.   [DISCONTINUED] amoxicillin-clavulanate (AUGMENTIN) 875-125 MG tablet Take 1 tablet by mouth 2 (two) times daily.   [DISCONTINUED] simvastatin (ZOCOR) 20 MG tablet Take 1 tablet (20 mg  total) by mouth at bedtime. (Patient not taking: Reported on 01/26/2022)   No facility-administered encounter medications on file as of 01/26/2022.

## 2022-01-26 NOTE — Assessment & Plan Note (Signed)
transient involving right eye,  But also noting he needs more light to read.  Recommend annual eye exams and screening for cataracts

## 2022-01-26 NOTE — Assessment & Plan Note (Signed)

## 2022-01-26 NOTE — Patient Instructions (Signed)
Go get an eye exam .  The place [facility] to [facility] is excellent  The ShingRx vaccine is now available in local pharmacies and is ADVISED for all interested adults over 50 to prevent shingles    CT renal stone study has been ordered  Cardiology referral has been ordered

## 2022-01-26 NOTE — Assessment & Plan Note (Addendum)
Has been noncompliant with simvastatin due to forgetting to take it. Discussed cardiology consult for cardiac screening.  Fasting lipids ordered

## 2022-01-27 ENCOUNTER — Other Ambulatory Visit: Payer: Self-pay

## 2022-01-27 ENCOUNTER — Telehealth: Payer: Self-pay

## 2022-01-27 DIAGNOSIS — Z1211 Encounter for screening for malignant neoplasm of colon: Secondary | ICD-10-CM

## 2022-01-27 MED ORDER — PEG 3350-KCL-NA BICARB-NACL 420 G PO SOLR: 4000.0000 mL | Freq: Once | ORAL | 0 refills | Status: AC

## 2022-01-27 NOTE — Telephone Encounter (Signed)
Gastroenterology Pre-Procedure Review  Request Date: 02/25/22 Requesting Physician: Dr. Allen Norris  PATIENT REVIEW QUESTIONS: The patient responded to the following health history questions as indicated:    1. Are you having any GI issues? yes (acid reflux controlled by diet and omeprazole) 2. Do you have a personal history of Polyps? no 3. Do you have a family history of Colon Cancer or Polyps? yes (sister polyps, maybe maternal grandmother colon cancer) 1. Diabetes Mellitus? no 5. Joint replacements in the past 12 months?no 6. Major health problems in the past 3 months?no 7. Any artificial heart valves, MVP, or defibrillator?no    MEDICATIONS & ALLERGIES:    Patient reports the following regarding taking any anticoagulation/antiplatelet therapy:   Plavix, Coumadin, Eliquis, Xarelto, Lovenox, Pradaxa, Brilinta, or Effient? no Aspirin? no  Patient confirms/reports the following medications:  Current Outpatient Medications  Medication Sig Dispense Refill   fluticasone (FLONASE) 50 MCG/ACT nasal spray Place 2 sprays into both nostrils daily. 16 g 6   omeprazole (PRILOSEC OTC) 20 MG tablet Take 20 mg by mouth as needed.     No current facility-administered medications for this visit.    Patient confirms/reports the following allergies:  No Known Allergies  No orders of the defined types were placed in this encounter.   AUTHORIZATION INFORMATION Primary Insurance: 1D#: Group #:  Secondary Insurance: 1D#: Group #:  SCHEDULE INFORMATION: Date:  Time: Location:

## 2022-02-22 ENCOUNTER — Telehealth: Payer: Self-pay | Admitting: Internal Medicine

## 2022-02-22 NOTE — Telephone Encounter (Signed)
Pt called requesting if Tullo would take on his wife as a new patient

## 2022-02-23 ENCOUNTER — Ambulatory Visit: Payer: 59

## 2022-02-23 NOTE — Telephone Encounter (Signed)
Pt is aware.  

## 2022-03-15 ENCOUNTER — Encounter: Payer: Self-pay | Admitting: Gastroenterology

## 2022-03-16 ENCOUNTER — Ambulatory Visit
Admission: RE | Admit: 2022-03-16 | Discharge: 2022-03-16 | Disposition: A | Discharge status: Home / Self Care | Payer: 59 | Source: Ambulatory Visit | Attending: Internal Medicine | Admitting: Internal Medicine

## 2022-03-16 DIAGNOSIS — Z87442 Personal history of urinary calculi: Secondary | ICD-10-CM | POA: Diagnosis present

## 2022-03-19 ENCOUNTER — Ambulatory Visit: Payer: 59 | Admitting: Anesthesiology

## 2022-03-19 ENCOUNTER — Ambulatory Visit
Admission: RE | Admit: 2022-03-19 | Discharge: 2022-03-19 | Disposition: A | Discharge status: Home / Self Care | Payer: 59 | Attending: Gastroenterology | Admitting: Gastroenterology

## 2022-03-19 ENCOUNTER — Encounter: Payer: Self-pay | Admitting: Gastroenterology

## 2022-03-19 ENCOUNTER — Ambulatory Visit: Payer: 59 | Admitting: Cardiology

## 2022-03-19 ENCOUNTER — Other Ambulatory Visit: Payer: Self-pay

## 2022-03-19 ENCOUNTER — Encounter
Admission: RE | Disposition: A | Discharge status: Home / Self Care | Payer: Self-pay | Source: Home / Self Care | Attending: Gastroenterology

## 2022-03-19 DIAGNOSIS — Z1211 Encounter for screening for malignant neoplasm of colon: Secondary | ICD-10-CM | POA: Diagnosis present

## 2022-03-19 DIAGNOSIS — G43909 Migraine, unspecified, not intractable, without status migrainosus: Secondary | ICD-10-CM | POA: Diagnosis not present

## 2022-03-19 DIAGNOSIS — D120 Benign neoplasm of cecum: Secondary | ICD-10-CM

## 2022-03-19 DIAGNOSIS — K640 First degree hemorrhoids: Secondary | ICD-10-CM | POA: Insufficient documentation

## 2022-03-19 DIAGNOSIS — K219 Gastro-esophageal reflux disease without esophagitis: Secondary | ICD-10-CM | POA: Diagnosis not present

## 2022-03-19 HISTORY — PX: COLONOSCOPY WITH PROPOFOL: SHX5780

## 2022-03-19 HISTORY — PX: POLYPECTOMY: SHX5525

## 2022-03-19 HISTORY — DX: Migraine, unspecified, not intractable, without status migrainosus: G43.909

## 2022-03-19 SURGERY — COLONOSCOPY WITH PROPOFOL
Anesthesia: General | Site: Rectum

## 2022-03-19 MED ORDER — STERILE WATER FOR IRRIGATION IR SOLN
Status: DC | PRN
  Administered 2022-03-19: 1

## 2022-03-19 MED ORDER — LIDOCAINE HCL (CARDIAC) PF 100 MG/5ML IV SOSY
PREFILLED_SYRINGE | INTRAVENOUS | Status: DC | PRN
  Administered 2022-03-19: 80 mg via INTRAVENOUS

## 2022-03-19 MED ORDER — STERILE WATER FOR IRRIGATION IR SOLN
Status: DC | PRN
  Administered 2022-03-19: 60 mL

## 2022-03-19 MED ORDER — PROPOFOL 10 MG/ML IV BOLUS
INTRAVENOUS | Status: DC | PRN
  Administered 2022-03-19: 40 mg via INTRAVENOUS
  Administered 2022-03-19: 50 mg via INTRAVENOUS
  Administered 2022-03-19 (×2): 20 mg via INTRAVENOUS
  Administered 2022-03-19: 30 mg via INTRAVENOUS
  Administered 2022-03-19: 40 mg via INTRAVENOUS
  Administered 2022-03-19: 80 mg via INTRAVENOUS

## 2022-03-19 MED ORDER — LACTATED RINGERS IV SOLN: INTRAVENOUS | Status: DC

## 2022-03-19 MED ORDER — SODIUM CHLORIDE 0.9 % IV SOLN: INTRAVENOUS | Status: DC

## 2022-03-19 SURGICAL SUPPLY — 7 items
FORCEPS BIOP RAD 4 LRG CAP 4 (CUTTING FORCEPS) IMPLANT
GOWN CVR UNV OPN BCK APRN NK (MISCELLANEOUS) ×2 IMPLANT
GOWN ISOL THUMB LOOP REG UNIV (MISCELLANEOUS) ×2
KIT PRC NS LF DISP ENDO (KITS) ×1 IMPLANT
KIT PROCEDURE OLYMPUS (KITS) ×1
MANIFOLD NEPTUNE II (INSTRUMENTS) ×1 IMPLANT
WATER STERILE IRR 250ML POUR (IV SOLUTION) ×1 IMPLANT

## 2022-03-19 NOTE — Anesthesia Postprocedure Evaluation (Signed)
Anesthesia Post Note  Patient: Seth Stone  Procedure(s) Performed: COLONOSCOPY WITH BIOPSY (Rectum) POLYPECTOMY (Rectum)  Patient location during evaluation: PACU Anesthesia Type: General Level of consciousness: awake and alert Pain management: pain level controlled Vital Signs Assessment: post-procedure vital signs reviewed and stable Respiratory status: spontaneous breathing, nonlabored ventilation, respiratory function stable and patient connected to nasal cannula oxygen Cardiovascular status: blood pressure returned to baseline and stable Postop Assessment: no apparent nausea or vomiting Anesthetic complications: no   No notable events documented.   Last Vitals:  Vitals:   03/19/22 0945 03/19/22 0953  BP: (!) 102/54 117/77  Pulse: 63 62  Resp: 18 20  Temp: (!) 36.4 C (!) 36.4 C  SpO2: 97% 95%    Last Pain:  Vitals:   03/19/22 0953  TempSrc:   PainSc: 0-No pain                 Arita Miss

## 2022-03-19 NOTE — Anesthesia Preprocedure Evaluation (Signed)
Anesthesia Evaluation  Patient identified by MRN, date of birth, ID band Patient awake    Reviewed: Allergy & Precautions, NPO status , Patient's Chart, lab work & pertinent test results  History of Anesthesia Complications Negative for: history of anesthetic complications  Airway Mallampati: II  TM Distance: >3 FB Neck ROM: Full    Dental no notable dental hx. (+) Teeth Intact   Pulmonary neg pulmonary ROS, neg sleep apnea, neg COPD, Patient abstained from smoking.Not current smoker,    Pulmonary exam normal breath sounds clear to auscultation       Cardiovascular Exercise Tolerance: Good METS(-) hypertension(-) CAD and (-) Past MI negative cardio ROS  (-) dysrhythmias  Rhythm:Regular Rate:Normal - Systolic murmurs    Neuro/Psych  Headaches, S/p C-spine surgery  Neuromuscular disease negative psych ROS   GI/Hepatic GERD  Medicated and Controlled,(+)     (-) substance abuse  ,   Endo/Other  neg diabetes  Renal/GU negative Renal ROS     Musculoskeletal   Abdominal   Peds  Hematology   Anesthesia Other Findings Past Medical History: No date: GERD (gastroesophageal reflux disease) No date: Kidney stone No date: Migraine headache     Comment:  1-2x/month No date: Urolithiasis  Reproductive/Obstetrics                             Anesthesia Physical Anesthesia Plan  ASA: 2  Anesthesia Plan: General   Post-op Pain Management: Minimal or no pain anticipated   Induction: Intravenous  PONV Risk Score and Plan: 2 and Propofol infusion, TIVA and Ondansetron  Airway Management Planned: Nasal Cannula  Additional Equipment: None  Intra-op Plan:   Post-operative Plan:   Informed Consent: I have reviewed the patients History and Physical, chart, labs and discussed the procedure including the risks, benefits and alternatives for the proposed anesthesia with the patient or authorized  representative who has indicated his/her understanding and acceptance.     Dental advisory given  Plan Discussed with: CRNA and Surgeon  Anesthesia Plan Comments: (Discussed risks of anesthesia with patient, including possibility of difficulty with spontaneous ventilation under anesthesia necessitating airway intervention, PONV, and rare risks such as cardiac or respiratory or neurological events, and allergic reactions. Discussed the role of CRNA in patient's perioperative care. Patient understands.)        Anesthesia Quick Evaluation

## 2022-03-19 NOTE — Op Note (Signed)
[facility] Gastroenterology Patient Name: Seth Stone Procedure Date: 03/19/2022 9:18 AM MRN: [id number] Account #: [id number] Date of Birth: 20-Jul-1970 Admit Type: Outpatient Age: 51 Room: [facility] OR ROOM 01 Gender: Male Note Status: Finalized Instrument Name: [id number] Procedure:             Colonoscopy Indications:           Screening for colorectal malignant neoplasm Providers:             Lucilla Lame MD, MD Referring MD:          Deborra Medina, MD (Referring MD) Medicines:             Propofol per Anesthesia Complications:         No immediate complications. Procedure:             Pre-Anesthesia Assessment:                        - Prior to the procedure, a History and Physical was                         performed, and patient medications and allergies were                         reviewed. The patient's tolerance of previous                         anesthesia was also reviewed. The risks and benefits                         of the procedure and the sedation options and risks                         were discussed with the patient. All questions were                         answered, and informed consent was obtained. Prior                         Anticoagulants: The patient has taken no anticoagulant                         or antiplatelet agents. ASA Grade Assessment: II - A                         patient with mild systemic disease. After reviewing                         the risks and benefits, the patient was deemed in                         satisfactory condition to undergo the procedure.                        After obtaining informed consent, the colonoscope was                         passed under direct vision. Throughout the procedure,  the patient's blood pressure, pulse, and oxygen                         saturations were monitored continuously. The                         Colonoscope was introduced through the anus and                          advanced to the the cecum, identified by appendiceal                         orifice and ileocecal valve. The colonoscopy was                         performed without difficulty. The patient tolerated                         the procedure well. The quality of the bowel                         preparation was excellent. Findings:      The perianal and digital rectal examinations were normal.      A 4 mm polyp was found in the cecum. The polyp was sessile. The polyp       was removed with a cold biopsy forceps. Resection and retrieval were       complete.      Non-bleeding internal hemorrhoids were found during retroflexion. The       hemorrhoids were Grade I (internal hemorrhoids that do not prolapse). Impression:            - One 4 mm polyp in the cecum, removed with a cold                         biopsy forceps. Resected and retrieved.                        - Non-bleeding internal hemorrhoids. Recommendation:        - Discharge patient to home.                        - Resume previous diet.                        - Continue present medications.                        - Await pathology results.                        - If the pathology report reveals adenomatous tissue,                         then repeat the colonoscopy for surveillance in 7                         years. Procedure Code(s):     --- Professional ---  45380, Colonoscopy, flexible; with biopsy, single or                         multiple Diagnosis Code(s):     --- Professional ---                        Z12.11, Encounter for screening for malignant neoplasm                         of colon                        D12.0, Benign neoplasm of cecum CPT copyright 2022 American Medical Association. All rights reserved. The codes documented in this report are preliminary and upon coder review may  be revised to meet current compliance requirements. Lucilla Lame MD, MD 03/19/2022 9:44:11  AM This report has been signed electronically. Number of Addenda: 0 Note Initiated On: 03/19/2022 9:18 AM Scope Withdrawal Time: 0 hours 7 minutes 11 seconds  Total Procedure Duration: 0 hours 10 minutes 45 seconds  Estimated Blood Loss:  Estimated blood loss: none.      [facility]

## 2022-03-19 NOTE — Transfer of Care (Signed)
Immediate Anesthesia Transfer of Care Note  Patient: Seth Stone  Procedure(s) Performed: COLONOSCOPY WITH BIOPSY (Rectum) POLYPECTOMY (Rectum)  Patient Location: PACU  Anesthesia Type: General  Level of Consciousness: awake, alert  and patient cooperative  Airway and Oxygen Therapy: Patient Spontanous Breathing and Patient connected to supplemental oxygen  Post-op Assessment: Post-op Vital signs reviewed, Patient's Cardiovascular Status Stable, Respiratory Function Stable, Patent Airway and No signs of Nausea or vomiting  Post-op Vital Signs: Reviewed and stable  Complications: No notable events documented.

## 2022-03-19 NOTE — H&P (Signed)
Lucilla Lame, MD [address], [place] [address] Phone: [phone] Fax : [phone]  Primary Care Physician:  Crecencio Mc, MD Primary Gastroenterologist:  Dr. Allen Norris  Pre-Procedure History & Physical: HPI:  Seth Stone is a 51 y.o. male is here for a screening colonoscopy.   Past Medical History:  Diagnosis Date   GERD (gastroesophageal reflux disease)    Kidney stone    Migraine headache    1-2x/month   Urolithiasis     Past Surgical History:  Procedure Laterality Date   CERVICAL DISC ARTHROPLASTY     C6/7   EXTRACORPOREAL SHOCK WAVE LITHOTRIPSY Left 09/27/2019   Procedure: EXTRACORPOREAL SHOCK WAVE LITHOTRIPSY (ESWL);  Surgeon: Billey Co, MD;  Location: [facility] ORS;  Service: Urology;  Laterality: Left;   hemorrhiordectomy  2012   LITHOTRIPSY  2017   NASAL SEPTUM SURGERY  2014   NASAL SINUS SURGERY  2016   URETEROSCOPY      Prior to Admission medications   Medication Sig Start Date End Date Taking? Authorizing Provider  aspirin-acetaminophen-caffeine (EXCEDRIN MIGRAINE) [phone] MG tablet Take by mouth every 6 (six) hours as needed for headache.   Yes [provider]  omeprazole (PRILOSEC OTC) 20 MG tablet Take 20 mg by mouth as needed.   Yes [provider]  fluticasone (FLONASE) 50 MCG/ACT nasal spray Place 2 sprays into both nostrils daily. 01/18/21   Sharion Balloon, FNP    Allergies as of 01/27/2022   (No Known Allergies)    Family History  Problem Relation Age of Onset   Diabetes Mother    Alcohol abuse Father    Arthritis Father    Hypertension Father    Emphysema Sister    Colon cancer Maternal Grandmother    Stroke Maternal Grandmother     Social History   Socioeconomic History   Marital status: Single    Spouse name: Not on file   Number of children: Not on file   Years of education: Not on file   Highest education level: Not on file  Occupational History   Not on file  Tobacco  Use   Smoking status: Never   Smokeless tobacco: Never  Vaping Use   Vaping Use: Never used  Substance and Sexual Activity   Alcohol use: Yes    Comment: rare   Drug use: No   Sexual activity: Yes    Birth control/protection: None  Other Topics Concern   Not on file  Social History Narrative   Not on file   Social Determinants of Health   Financial Resource Strain: Not on file  Food Insecurity: Not on file  Transportation Needs: Not on file  Physical Activity: Not on file  Stress: Not on file  Social Connections: Not on file  Intimate Partner Violence: Not on file    Review of Systems: See HPI, otherwise negative ROS  Physical Exam: BP 125/85   Temp 97.9 F (36.6 C) (Tympanic)   Ht 6' 0.99" (1.854 m)   Wt 105.2 kg   SpO2 97%   BMI 30.62 kg/m  General:   Alert,  pleasant and cooperative in NAD Head:  Normocephalic and atraumatic. Neck:  Supple; no masses or thyromegaly. Lungs:  Clear throughout to auscultation.    Heart:  Regular rate and rhythm. Abdomen:  Soft, nontender and nondistended. Normal bowel sounds, without guarding, and without rebound.   Neurologic:  Alert and  oriented x4;  grossly normal neurologically.  Impression/Plan: Cassandria Santee  Antonio Madonna is now here to undergo a screening colonoscopy.  Risks, benefits, and alternatives regarding colonoscopy have been reviewed with the patient.  Questions have been answered.  All parties agreeable.

## 2022-03-22 ENCOUNTER — Encounter: Payer: Self-pay | Admitting: Gastroenterology

## 2022-03-22 LAB — SURGICAL PATHOLOGY

## 2022-03-25 ENCOUNTER — Encounter: Payer: Self-pay | Admitting: Internal Medicine

## 2022-03-25 ENCOUNTER — Ambulatory Visit: Payer: 59 | Attending: Cardiology | Admitting: Internal Medicine

## 2022-03-25 VITALS — BP 122/88 | HR 63 | Ht 73.0 in | Wt 236.0 lb

## 2022-03-25 DIAGNOSIS — I700 Atherosclerosis of aorta: Secondary | ICD-10-CM

## 2022-03-25 NOTE — Progress Notes (Signed)
Cardiology Office Note:    Date:  03/25/2022   ID:  [place], [place] Jun 06, 1970, MRN [id number]  PCP:  Crecencio Mc, MD   [facility] Providers Cardiologist:  None     Referring MD: Crecencio Mc, MD   No chief complaint on file. Atherosclerosis   History of Present Illness:    Seth Stone is a 51 y.o. male with a hx of GERD, aortic atherosclerosis seen on CT renal stone [place], not taking statin. Notes he weighted more in the past lost ~ 50 pounds. He exercises. He does cardio and resistance training. He works out up to an hour.  Blood pressure is normal.  He denies angina, dyspnea on exertion, lower extremity edema, PND or orthopnea.    The 10-year ASCVD risk score (Arnett DK, et al., 2019) is: 2.7%   Values used to calculate the score:     Age: 11 years     Sex: Male     Is Non-Hispanic African American: No     Diabetic: No     Tobacco smoker: No     Systolic Blood Pressure: 553 mmHg     Is BP treated: No     HDL Cholesterol: 45.7 mg/dL     Total Cholesterol: 153 mg/dL   Past Medical History:  Diagnosis Date   GERD (gastroesophageal reflux disease)    Kidney stone    Migraine headache    1-2x/month   Urolithiasis     Past Surgical History:  Procedure Laterality Date   CERVICAL DISC ARTHROPLASTY     C6/7   COLONOSCOPY WITH PROPOFOL N/A 03/19/2022   Procedure: COLONOSCOPY WITH BIOPSY;  Surgeon: Lucilla Lame, MD;  Location: [facility];  Service: Endoscopy;  Laterality: N/A;   EXTRACORPOREAL SHOCK WAVE LITHOTRIPSY Left 09/27/2019   Procedure: EXTRACORPOREAL SHOCK WAVE LITHOTRIPSY (ESWL);  Surgeon: Billey Co, MD;  Location: [facility];  Service: Urology;  Laterality: Left;   hemorrhiordectomy  2012   LITHOTRIPSY  2017   NASAL SEPTUM SURGERY  2014   NASAL SINUS SURGERY  2016   POLYPECTOMY N/A 03/19/2022   Procedure: POLYPECTOMY;  Surgeon: Lucilla Lame, MD;  Location: [facility];  Service: Endoscopy;  Laterality: N/A;    URETEROSCOPY      Current Medications: Current Meds  Medication Sig   aspirin-acetaminophen-caffeine (EXCEDRIN MIGRAINE) 250-250-65 MG tablet Take by mouth every 6 (six) hours as needed for headache.   omeprazole (PRILOSEC OTC) 20 MG tablet Take 20 mg by mouth as needed.     Allergies:   Patient has no known allergies.   Social History   Socioeconomic History   Marital status: Single    Spouse name: Not on file   Number of children: Not on file   Years of education: Not on file   Highest education level: Not on file  Occupational History   Not on file  Tobacco Use   Smoking status: Never   Smokeless tobacco: Never  Vaping Use   Vaping Use: Never used  Substance and Sexual Activity   Alcohol use: Yes    Comment: rare   Drug use: No   Sexual activity: Yes    Birth control/protection: None  Other Topics Concern   Not on file  Social History Narrative   Not on file   Social Determinants of Health   Financial Resource Strain: Not on file  Food Insecurity: Not on file  Transportation Needs: Not on file  Physical Activity: Not  on file  Stress: Not on file  Social Connections: Not on file     Family History: The patient's family history includes Alcohol abuse in his father; Arthritis in his father; Colon cancer in his maternal grandmother; Diabetes in his mother; Emphysema in his sister; Hypertension in his father; Stroke in his maternal grandmother.  ROS:   Please see the history of present illness.     All other systems reviewed and are negative.  EKGs/Labs/Other Studies Reviewed:    The following studies were reviewed today:   EKG:  EKG is  ordered today.  The ekg ordered today demonstrates   03/25/2022- NSR, possible septal q  Recent Labs: 01/26/2022: ALT 26; BUN 15; Creatinine, Ser 0.98; Hemoglobin 15.5; Platelets 190.0; Potassium 4.4; Sodium 142; TSH 1.16  Recent Lipid Panel    Component Value Date/Time   CHOL 153 01/26/2022 1156   TRIG 89.0 01/26/2022  1156   HDL 45.70 01/26/2022 1156   CHOLHDL 3 01/26/2022 1156   VLDL 17.8 01/26/2022 1156   LDLCALC 89 01/26/2022 1156   LDLDIRECT 103.0 01/26/2022 1156     Risk Assessment/Calculations:         Physical Exam:    VS:    Vitals:   03/25/22 1455  BP: 122/88  Pulse: 63  SpO2: 99%      Wt Readings from Last 3 Encounters:  03/25/22 236 lb (107 kg)  03/19/22 232 lb (105.2 kg)  01/26/22 232 lb (105.2 kg)     GEN:  Well nourished, well developed in no acute distress HEENT: Normal NECK: No JVD; No carotid bruits LYMPHATICS: No lymphadenopathy CARDIAC: RRR, no murmurs, rubs, gallops RESPIRATORY:  Clear to auscultation without rales, wheezing or rhonchi  ABDOMEN: Soft, non-tender, non-distended MUSCULOSKELETAL:  No edema; No deformity  SKIN: Warm and dry NEUROLOGIC:  Alert and oriented x 3 PSYCHIATRIC:  Normal affect   ASSESSMENT:   Aortic Atherosclerosis: ASCVD score is low. However noted atherosclerosis. Will plan for CAC score to determine necessity for statin/CVD risk stratification PLAN:    In order of problems listed above:  CT Cardiac score Follow up pending results       Medication Adjustments/Labs and Tests Ordered: Current medicines are reviewed at length with the patient today.  Concerns regarding medicines are outlined above.  No orders of the defined types were placed in this encounter.  No orders of the defined types were placed in this encounter.   There are no Patient Instructions on file for this visit.   Signed, Janina Mayo, MD  03/25/2022 3:26 PM    [place]

## 2022-03-25 NOTE — Patient Instructions (Signed)
Medication Instructions:  Your physician recommends that you continue on your current medications as directed. Please refer to the Current Medication list given to you today.  *If you need a refill on your cardiac medications before your next appointment, please call your pharmacy*   Lab Work: NONE If you have labs (blood work) drawn today and your tests are completely normal, you will receive your results only by: [place] (if you have MyChart) OR A paper copy in the mail If you have any lab test that is abnormal or we need to change your treatment, we will call you to review the results.   Testing/Procedures: Dr. Phineas Inches has ordered a CT coronary calcium score.   Test locations:  [place]   This is $99 out of pocket.   Coronary CalciumScan A coronary calcium scan is an imaging test used to look for deposits of calcium and other fatty materials (plaques) in the inner lining of the blood vessels of the heart (coronary arteries). These deposits of calcium and plaques can partly clog and narrow the coronary arteries without producing any symptoms or warning signs. This puts a person at risk for a heart attack. This test can detect these deposits before symptoms develop. Tell a health care provider about: Any allergies you have. All medicines you are taking, including vitamins, herbs, eye drops, creams, and over-the-counter medicines. Any problems you or family members have had with anesthetic medicines. Any blood disorders you have. Any surgeries you have had. Any medical conditions you have. Whether you are pregnant or may be pregnant. What are the risks? Generally, this is a safe procedure. However, problems may occur, including: Harm to a pregnant woman and her unborn baby. This test involves the use of radiation. Radiation exposure can be dangerous to a pregnant woman and her unborn baby. If you are pregnant, you generally should not  have this procedure done. Slight increase in the risk of cancer. This is because of the radiation involved in the test. What happens before the procedure? No preparation is needed for this procedure. What happens during the procedure? You will undress and remove any jewelry around your neck or chest. You will put on a hospital gown. Sticky electrodes will be placed on your chest. The electrodes will be connected to an electrocardiogram (ECG) machine to record a tracing of the electrical activity of your heart. A CT scanner will take pictures of your heart. During this time, you will be asked to lie still and hold your breath for 2-3 seconds while a picture of your heart is being taken. The procedure may vary among health care providers and hospitals. What happens after the procedure? You can get dressed. You can return to your normal activities. It is up to you to get the results of your test. Ask your health care provider, or the department that is doing the test, when your results will be ready. Summary A coronary calcium scan is an imaging test used to look for deposits of calcium and other fatty materials (plaques) in the inner lining of the blood vessels of the heart (coronary arteries). Generally, this is a safe procedure. Tell your health care provider if you are pregnant or may be pregnant. No preparation is needed for this procedure. A CT scanner will take pictures of your heart. You can return to your normal activities after the scan is done. This information is not intended to replace advice given to you by your health care  provider. Make sure you discuss any questions you have with your health care provider. Document Released: 11/06/2007 Document Revised: 03/29/2016 Document Reviewed: 03/29/2016 Elsevier Interactive Patient Education  2017 [place]: At [facility], you and your health needs are our priority.  As part of our continuing mission to  provide you with exceptional heart care, we have created designated Provider Care Teams.  These Care Teams include your primary Cardiologist (physician) and Advanced Practice Providers (APPs -  Physician Assistants and Nurse Practitioners) who all work together to provide you with the care you need, when you need it.  We recommend signing up for the patient portal called "MyChart".  Sign up information is provided on this After Visit Summary.  MyChart is used to connect with patients for Virtual Visits (Telemedicine).  Patients are able to view lab/test results, encounter notes, upcoming appointments, etc.  Non-urgent messages can be sent to your provider as well.   To learn more about what you can do with MyChart, go to [email or web address].    Your next appointment:   As Needed   The format for your next appointment:   In Person  Provider:   Phineas Inches, MD

## 2022-04-23 ENCOUNTER — Ambulatory Visit
Admission: RE | Admit: 2022-04-23 | Discharge: 2022-04-23 | Disposition: A | Discharge status: Home / Self Care | Payer: 59 | Source: Ambulatory Visit | Attending: Internal Medicine | Admitting: Internal Medicine

## 2022-04-23 DIAGNOSIS — I700 Atherosclerosis of aorta: Secondary | ICD-10-CM

## 2022-05-09 ENCOUNTER — Encounter: Payer: Self-pay | Admitting: Internal Medicine

## 2022-05-10 NOTE — Telephone Encounter (Signed)
Printed form and placed it in the red folder.

## 2022-05-11 ENCOUNTER — Other Ambulatory Visit: Payer: Self-pay | Admitting: Internal Medicine

## 2022-06-09 NOTE — Telephone Encounter (Signed)
Pt has been scheduled for tomorrow at 8:30.

## 2022-06-10 ENCOUNTER — Encounter: Payer: Self-pay | Admitting: Internal Medicine

## 2022-06-10 ENCOUNTER — Ambulatory Visit: Payer: 59 | Admitting: Internal Medicine

## 2022-06-10 VITALS — BP 122/76 | HR 64 | Temp 98.1°F | Ht 73.0 in | Wt 245.0 lb

## 2022-06-10 DIAGNOSIS — Z87442 Personal history of urinary calculi: Secondary | ICD-10-CM | POA: Diagnosis not present

## 2022-06-10 DIAGNOSIS — K429 Umbilical hernia without obstruction or gangrene: Secondary | ICD-10-CM

## 2022-06-10 DIAGNOSIS — E669 Obesity, unspecified: Secondary | ICD-10-CM

## 2022-06-10 DIAGNOSIS — K420 Umbilical hernia with obstruction, without gangrene: Secondary | ICD-10-CM | POA: Insufficient documentation

## 2022-06-10 DIAGNOSIS — K4090 Unilateral inguinal hernia, without obstruction or gangrene, not specified as recurrent: Secondary | ICD-10-CM | POA: Insufficient documentation

## 2022-06-10 DIAGNOSIS — Z8719 Personal history of other diseases of the digestive system: Secondary | ICD-10-CM | POA: Insufficient documentation

## 2022-06-10 DIAGNOSIS — M50123 Cervical disc disorder at C6-C7 level with radiculopathy: Secondary | ICD-10-CM

## 2022-06-10 LAB — COMPREHENSIVE METABOLIC PANEL
ALT: 38 U/L (ref 0–53)
AST: 26 U/L (ref 0–37)
Albumin: 4.2 g/dL (ref 3.5–5.2)
Alkaline Phosphatase: 73 U/L (ref 39–117)
BUN: 19 mg/dL (ref 6–23)
CO2: 30 mEq/L (ref 19–32)
Calcium: 9.3 mg/dL (ref 8.4–10.5)
Chloride: 107 mEq/L (ref 96–112)
Creatinine, Ser: 0.92 mg/dL (ref 0.40–1.50)
GFR: 96.42 mL/min (ref >60.00)
Glucose, Bld: 93 mg/dL (ref 70–99)
Potassium: 4.4 mEq/L (ref 3.5–5.1)
Sodium: 142 mEq/L (ref 135–145)
Total Bilirubin: 0.8 mg/dL (ref 0.2–1.2)
Total Protein: 7.3 g/dL (ref 6.0–8.3)

## 2022-06-10 LAB — URINALYSIS, ROUTINE W REFLEX MICROSCOPIC
Bilirubin Urine: NEGATIVE
Hgb urine dipstick: NEGATIVE
Ketones, ur: NEGATIVE
Leukocytes,Ua: NEGATIVE
Nitrite: NEGATIVE
RBC / HPF: NONE SEEN (ref 0–?)
Specific Gravity, Urine: 1.02 (ref 1.000–1.030)
Total Protein, Urine: NEGATIVE
Urine Glucose: NEGATIVE
Urobilinogen, UA: 0.2 (ref 0.0–1.0)
WBC, UA: NONE SEEN (ref 0–?)
pH: 6 (ref 5.0–8.0)

## 2022-06-10 NOTE — Assessment & Plan Note (Signed)
Referring to Dr Dahlia Byes for surgery (deferred by patient in 2021)

## 2022-06-10 NOTE — Patient Instructions (Signed)
Referral to Dr Dahlia Byes in progress for hernia repair  TAKE YOUR SUPPLEMENT 2 PILLS TWICE DAILY  60 OUNCES OF WATER DAILY MINIMUM   READ THE HANDOUT FOR MORE DIETARY TIPS

## 2022-06-10 NOTE — Progress Notes (Addendum)
Subjective:  Patient ID: Seth Stone, male    DOB: Dec 30, 1970  Age: 52 y.o. MRN: [id number]  CC: The primary encounter diagnosis was History of nephrolithiasis. Diagnoses of Obesity (BMI 30.0-34.9), Unil inguinal hernia, w/o obst or gangr, not spcf as recur, Umbilical hernia without obstruction and without gangrene, and Cervical disc disorder at C6-C7 level with radiculopathy were also pertinent to this visit.   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  The [facility] requires a letter regarding his medical conditions   1) Nephrolithiasis:  His first episode  occurred in 2003.  2nd episode was in 2018, required lithotripsy which was  done in [place], [place].  Most recent symptomatic episode  OCCURRED IN MAY  2021 which was treated again with  LITHOTRIPSY . Stone analysis was done in 2021 and calcium oxalate noted.  He has been taking supplement to prevent stone formation and has been drinking increased amounts of water since 2021 and has had no symptoms  or recurrence.  He has  had surveillance CTs per FAA guidelines  and most recent CT  in October noted a 1 mm nonobstructing stone in the  mid pelvis of the right kidney.  .  A urinalysis today is negative for red blood cells and crystals.   2) Cervical disk disease: [name] underwent C6-7 arthroplasty for cervical radiculopathy on  Jul 04 2018 by Meade Maw, MD .  His follow up films were normal and his symptoms of pain resolved after decompression and have not returned.  3) right inguinal  and umbilical  hernia:  he was referred to Dr Dahlia Byes  in May 2021 and offered surgery to repair both in one setting ,  but deferred due to work schedule.  He is now having daily symptoms of bulge and pressure and requests repeat evaluation   Outpatient Medications Prior to Visit  Medication  Sig Dispense Refill   aspirin-acetaminophen-caffeine (EXCEDRIN MIGRAINE) 250-250-65 MG tablet Take by mouth every 6 (six) hours as needed for headache.     fluticasone (FLONASE) 50 MCG/ACT nasal spray Place 2 sprays into both nostrils daily. 16 g 6   omeprazole (PRILOSEC OTC) 20 MG tablet Take 20 mg by mouth as needed.     No facility-administered medications prior to visit.    Review of Systems;  Patient denies headache, fevers, malaise, unintentional weight loss, skin rash, eye pain, sinus congestion and sinus pain, sore throat, dysphagia,  hemoptysis , cough, dyspnea, wheezing, chest pain, palpitations, orthopnea, edema, abdominal pain, nausea, melena, diarrhea, constipation, flank pain, dysuria, hematuria, urinary  Frequency, nocturia, numbness, tingling, seizures,  Focal weakness, Loss of consciousness,  Tremor, insomnia, depression, anxiety, and suicidal ideation.      Objective:  BP 122/76   Pulse 64   Temp 98.1 F (36.7 C) (Oral)   Ht 6' 1"$  (1.854 m)   Wt 245 lb (111.1 kg)   SpO2 98%   BMI 32.32 kg/m   BP Readings from Last 3 Encounters:  06/10/22 122/76  03/25/22 122/88  03/19/22 117/77    Wt Readings from Last 3 Encounters:  06/10/22 245 lb (111.1 kg)  03/25/22 236 lb (107 kg)  03/19/22 232 lb (105.2 kg)    Physical Exam Vitals reviewed.  Constitutional:      General: He is not in acute distress.  Appearance: Normal appearance. He is normal weight. He is not ill-appearing, toxic-appearing or diaphoretic.  HENT:     Head: Normocephalic and atraumatic.     Right Ear: Tympanic membrane, ear canal and external ear normal. There is no impacted cerumen.     Left Ear: Tympanic membrane, ear canal and external ear normal. There is no impacted cerumen.     Nose: Nose normal.     Mouth/Throat:     Mouth: Mucous membranes are moist.     Pharynx: Oropharynx is clear.  Eyes:     General: No scleral icterus.       Right eye: No discharge.        Left eye: No  discharge.     Conjunctiva/sclera: Conjunctivae normal.  Neck:     Thyroid: No thyromegaly.     Vascular: No carotid bruit or JVD.  Cardiovascular:     Rate and Rhythm: Normal rate and regular rhythm.     Heart sounds: Normal heart sounds.  Pulmonary:     Effort: Pulmonary effort is normal. No respiratory distress.     Breath sounds: Normal breath sounds.  Abdominal:     General: Bowel sounds are normal.     Palpations: Abdomen is soft. There is no mass.     Tenderness: There is no abdominal tenderness. There is no guarding or rebound.  Musculoskeletal:        General: No deformity. Normal range of motion.     Cervical back: Normal range of motion and neck supple.  Lymphadenopathy:     Cervical: No cervical adenopathy.  Skin:    General: Skin is warm and dry.  Neurological:     General: No focal deficit present.     Mental Status: He is alert and oriented to person, place, and time. Mental status is at baseline.     Sensory: Sensation is intact.     Motor: Motor function is intact. No weakness.     Coordination: Coordination normal.     Deep Tendon Reflexes:     Reflex Scores:      Tricep reflexes are 2+ on the right side and 2+ on the left side.      Bicep reflexes are 2+ on the right side and 2+ on the left side.      Brachioradialis reflexes are 2+ on the right side and 2+ on the left side.      Patellar reflexes are 2+ on the right side and 2+ on the left side.      Achilles reflexes are 2+ on the right side and 2+ on the left side. Psychiatric:        Mood and Affect: Mood normal.        Behavior: Behavior normal.        Thought Content: Thought content normal.        Judgment: Judgment normal.     Lab Results  Component Value Date   HGBA1C 5.5 01/26/2022    Lab Results  Component Value Date   CREATININE 0.92 06/10/2022   CREATININE 0.98 01/26/2022   CREATININE 1.01 08/21/2020    Lab Results  Component Value Date   WBC 4.8 01/26/2022   HGB 15.5  01/26/2022   HCT 45.8 01/26/2022   PLT 190.0 01/26/2022   GLUCOSE 93 06/10/2022   CHOL 168 06/10/2022   TRIG 61 06/10/2022   HDL 51 06/10/2022   LDLDIRECT 103.0 01/26/2022   LDLCALC 102 (H) 06/10/2022   ALT 38 06/10/2022  AST 26 06/10/2022   NA 142 06/10/2022   K 4.4 06/10/2022   CL 107 06/10/2022   CREATININE 0.92 06/10/2022   BUN 19 06/10/2022   CO2 30 06/10/2022   TSH 1.16 01/26/2022   PSA 2.40 01/26/2022   HGBA1C 5.5 01/26/2022   MICROALBUR 0.9 07/13/2017    CT CARDIAC SCORING (SELF PAY ONLY)  Addendum Date: 04/23/2022   ADDENDUM REPORT: 04/23/2022 15:56 EXAM: OVER-READ INTERPRETATION  CT CHEST The following report is an over-read performed by radiologist Dr. Yvonne Kendall of [facility], [place] on 04/23/2022. This over-read does not include interpretation of cardiac or coronary anatomy or pathology. The coronary calcium score interpretation by the cardiologist is attached. COMPARISON:  None. FINDINGS: Cardiovascular: There are no significant extracardiac vascular findings. Mediastinum/Nodes: There are no enlarged lymph nodes within the visualized mediastinum. Lungs/Pleura: There is no pleural effusion. Mild left greater than right lower lobe ground-glass and curvilinear likely subsegmental atelectasis. Upper abdomen: No significant findings in the visualized upper abdomen. Musculoskeletal/Chest wall: No chest wall mass or suspicious osseous findings within the visualized chest. IMPRESSION: No significant extracardiac findings within the visualized chest. Electronically Signed   By: Yvonne Kendall M.D.   On: 04/23/2022 15:56   Result Date: 04/23/2022 CLINICAL DATA:  Risk stratification EXAM: Coronary Calcium Score TECHNIQUE: The patient was scanned on a Siemens Somatom go.Top Scanner. Axial non-contrast 3 mm slices were carried out through the heart. The data set was analyzed on a dedicated work station and scored using the [place]. FINDINGS: Non-cardiac: See separate report  from [facility]. Ascending Aorta: Normal size Pericardium: Normal Coronary arteries: Normal origin of left and right coronary arteries. Distribution of arterial calcifications if present, as noted below; LM 0 LAD 0 LCx 0 RCA 0 Total 0 IMPRESSION AND RECOMMENDATION: 1.  Normal coronary calcium score of 0.  Patient is low risk. 2.  CAC 0, CAC-DRS A0. 3.  Continue heart healthy lifestyle and risk factor modification. Electronically Signed: By: Kate Sable M.D. On: 04/23/2022 15:31    Assessment & Plan:  .History of nephrolithiasis -     Urinalysis, Routine w reflex microscopic  Obesity (BMI 30.0-34.9) -     Comprehensive metabolic panel -     Lipid Panel w/reflex Direct LDL  Unil inguinal hernia, w/o obst or gangr, not spcf as recur Assessment & Plan: Referring to Dr Dahlia Byes for surgery (deferred by patient in 2021)  Orders: -     Ambulatory referral to General Surgery  Umbilical hernia without obstruction and without gangrene Assessment & Plan: Referring to Dr Dahlia Byes for surgery (deferred by patient in 2021)  Orders: -     Ambulatory referral to General Surgery  Cervical disc disorder at C6-C7 level with radiculopathy Assessment & Plan: S/p C6-7 arthropolasty in February 2020 with excellent results and complete resolution of radiculopathy. He has maintained excellent cervical spine ROM and upper extremity reflexes, sensation and strength .        I provided  40 minutes of face-to-face time during this encounter reviewing patient's last visit with me, patient's  most recent visit with urology and General surgery ,  recent surgical and non surgical procedures, previous  labs and imaging studies, counseling on currently addressed issues,  and post visit ordering to diagnostics and therapeutics .   Follow-up: Return in about 6 months (around 12/09/2022).   Crecencio Mc, MD

## 2022-06-11 LAB — LIPID PANEL W/REFLEX DIRECT LDL
Cholesterol: 168 mg/dL (ref <200)
HDL: 51 mg/dL (ref >=40)
LDL Cholesterol (Calc): 102 mg/dL (calc) — ABNORMAL HIGH
Non-HDL Cholesterol (Calc): 117 mg/dL (calc) (ref <130)
Total CHOL/HDL Ratio: 3.3 (calc) (ref <5.0)
Triglycerides: 61 mg/dL (ref <150)

## 2022-07-05 ENCOUNTER — Ambulatory Visit: Payer: 59 | Admitting: Surgery

## 2022-07-05 ENCOUNTER — Encounter: Payer: Self-pay | Admitting: Internal Medicine

## 2022-07-08 NOTE — Assessment & Plan Note (Addendum)
S/p C6-7 arthropolasty in February 2020 with excellent results and complete resolution of radiculopathy. He has maintained excellent cervical spine ROM and upper extremity reflexes, sensation and strength .

## 2022-07-23 NOTE — Telephone Encounter (Signed)
Ok to send office notes ?

## 2022-09-02 ENCOUNTER — Encounter: Payer: Self-pay | Admitting: Internal Medicine

## 2022-09-02 NOTE — Telephone Encounter (Signed)
Pt called stating he want the visit notes resent because they did not receive them

## 2022-12-09 ENCOUNTER — Ambulatory Visit: Payer: 59 | Admitting: Internal Medicine

## 2022-12-15 ENCOUNTER — Telehealth: Payer: Self-pay

## 2022-12-15 ENCOUNTER — Telehealth: Payer: Self-pay | Admitting: Internal Medicine

## 2022-12-15 ENCOUNTER — Ambulatory Visit: Payer: 59 | Admitting: Internal Medicine

## 2022-12-15 ENCOUNTER — Encounter: Payer: Self-pay | Admitting: Internal Medicine

## 2022-12-15 VITALS — BP 117/77 | HR 63 | Temp 97.7°F | Ht 73.0 in | Wt 236.2 lb

## 2022-12-15 DIAGNOSIS — R972 Elevated prostate specific antigen [PSA]: Secondary | ICD-10-CM

## 2022-12-15 DIAGNOSIS — K429 Umbilical hernia without obstruction or gangrene: Secondary | ICD-10-CM

## 2022-12-15 DIAGNOSIS — D126 Benign neoplasm of colon, unspecified: Secondary | ICD-10-CM | POA: Diagnosis not present

## 2022-12-15 DIAGNOSIS — K4090 Unilateral inguinal hernia, without obstruction or gangrene, not specified as recurrent: Secondary | ICD-10-CM

## 2022-12-15 DIAGNOSIS — Z8042 Family history of malignant neoplasm of prostate: Secondary | ICD-10-CM

## 2022-12-15 NOTE — Progress Notes (Addendum)
Subjective:  Patient ID: Seth Stone, male    DOB: 1971-01-29  Age: 52 y.o. MRN: [id number]  CC: The primary encounter diagnosis was Tubular adenoma of colon. Diagnoses of Umbilical hernia without obstruction and without gangrene and Unil inguinal hernia, w/o obst or gangr, not spcf as recur were also pertinent to this visit.   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   The right sided  inguinal hernia has been asymptomatic for the last several years . Surgery was offered in 2021 but deferred because he was, and is asymptomatic . He exercises regularly and has no pain or discoloration of the area.   His umbilical hernia  is also asymptomatic and fat filled and was found incidentally  on 2019  CT scan .   Outpatient Medications Prior to Visit  Medication Sig Dispense Refill   aspirin-acetaminophen-caffeine (EXCEDRIN MIGRAINE) 250-250-65 MG tablet Take by mouth every 6 (six) hours as needed for headache.     fluticasone (FLONASE) 50 MCG/ACT nasal spray Place 2 sprays into both nostrils daily. 16 g 6   omeprazole (PRILOSEC OTC) 20 MG tablet Take 20 mg by mouth as needed.     No facility-administered medications prior to visit.    Review of Systems;  Patient denies headache, fevers, malaise, unintentional weight loss, skin rash, eye pain, sinus congestion and sinus pain, sore throat, dysphagia,  hemoptysis , cough, dyspnea, wheezing, chest pain, palpitations, orthopnea, edema, abdominal pain, nausea, melena, diarrhea, constipation, flank pain, dysuria, hematuria, urinary  Frequency, nocturia, numbness, tingling, seizures,  Focal weakness, Loss of consciousness,  Tremor, insomnia, depression, anxiety, and suicidal  ideation.      Objective:  BP 117/77   Pulse 63   Temp 97.7 F (36.5 C) (Oral)   Ht 6\' 1"  (1.854 m)   Wt 236 lb 3.2 oz (107.1 kg)   SpO2 98%   BMI 31.16 kg/m   BP Readings from Last 3 Encounters:  12/15/22 117/77  06/10/22 122/76  03/25/22 122/88    Wt Readings from Last 3 Encounters:  12/15/22 236 lb 3.2 oz (107.1 kg)  06/10/22 245 lb (111.1 kg)  03/25/22 236 lb (107 kg)    Physical Exam Vitals reviewed.  Constitutional:      General: He is not in acute distress.    Appearance: Normal appearance. He is normal weight. He is not ill-appearing, toxic-appearing or diaphoretic.  HENT:     Head: Normocephalic and atraumatic.     Right Ear: Tympanic membrane, ear canal and external ear normal. There is no impacted cerumen.     Left Ear: Tympanic membrane, ear canal and external ear normal. There is no impacted cerumen.     Nose: Nose normal.     Mouth/Throat:     Mouth: Mucous membranes are moist.     Pharynx: Oropharynx is clear.  Eyes:     General: No scleral icterus.       Right eye: No discharge.        Left eye: No discharge.     Conjunctiva/sclera: Conjunctivae normal.  Neck:     Thyroid: No thyromegaly.     Vascular: No carotid bruit or JVD.  Cardiovascular:     Rate  and Rhythm: Normal rate and regular rhythm.     Heart sounds: Normal heart sounds.  Pulmonary:     Effort: Pulmonary effort is normal. No respiratory distress.     Breath sounds: Normal breath sounds.  Abdominal:     General: Bowel sounds are normal.     Palpations: Abdomen is soft. There is no mass.     Tenderness: There is no abdominal tenderness. There is no guarding or rebound.     Hernia: A hernia is present. Hernia is present in the umbilical area and right inguinal area.       Comments: Nontender fat filled rediucible umbilical hernia without color change   Genitourinary:    Testes:        Right: Swelling present.       Comments: Nontender slight swelling in right inguinal  region.   No color change Musculoskeletal:        General: Normal range of motion.     Cervical back: Normal range of motion and neck supple.  Lymphadenopathy:     Cervical: No cervical adenopathy.  Skin:    General: Skin is warm and dry.  Neurological:     General: No focal deficit present.     Mental Status: He is alert and oriented to person, place, and time. Mental status is at baseline.  Psychiatric:        Mood and Affect: Mood normal.        Behavior: Behavior normal.        Thought Content: Thought content normal.        Judgment: Judgment normal.      Assessment & Plan:  .Tubular adenoma of colon  Umbilical hernia without obstruction and without gangrene Assessment & Plan: The hernia is fat filled , and does not require intervention unless it becomes symptomatic    Unil inguinal hernia, w/o obst or gangr, not spcf as recur Assessment & Plan: There has been no significant change over the last 3 years , and he remains asymptomatic      Follow-up: No follow-ups on file.   Seth Shams, MD

## 2022-12-15 NOTE — Assessment & Plan Note (Signed)
There has been no significant change over the last 3 years , and he remains asymptomatic

## 2022-12-15 NOTE — Telephone Encounter (Signed)
At check-out, I let patient know that we do not have instructions to schedule a follow-up.  Patient asked that I verify with Dr. Duncan Dull that he does not need to go ahead and schedule an appointment.  I was unable to reach Sandy Salaam, CMA, at the time of the call so I let patient know that I will send Dr. Darrick Huntsman a message and we will be back in touch with him to let him know her response.

## 2022-12-15 NOTE — Telephone Encounter (Signed)
When would you like for pt to follow up with you 6 months or year?

## 2022-12-15 NOTE — Assessment & Plan Note (Signed)
The hernia is fat filled , and does not require intervention unless it becomes symptomatic

## 2022-12-15 NOTE — Assessment & Plan Note (Deleted)
Repeat colonscopy needed in 2030 (7 yr follow up per Wohl/[employer] guidelines)

## 2022-12-16 NOTE — Telephone Encounter (Signed)
Spoke with pt and scheduled his 6 month follow up.

## 2022-12-20 NOTE — Telephone Encounter (Signed)
Urology referral in progress

## 2022-12-23 NOTE — Progress Notes (Unsigned)
12/24/2022 5:20 PM   Seth Stone 1971-02-03 [id number]  Referring provider: Sherlene Shams, MD [address] [place],  [name] [address]  Urological history: 1. Nephrolithiasis -stone composition 75% calcium oxalate dihydrate and 25% calcium oxalate monohydrate  -URS ~ 15 years ago -left ESWL -CT renal stone study (02/2022) - 1 mm non obstructing stone  2. BPH with LU TS -PSA (01/2022) 2.40  Chief Complaint  Patient presents with   Elevated PSA   HPI: Seth Stone is a 52 y.o. male who presents today for increasing PSA  Previous records reviewed.   His PSA increased from 1.16 in 2021 to 2.40 in September 2023.  While this is within acceptable limits, he is concerned because his brother was recently diagnosed with prostate cnacer two months ago.  He denies any other family history of prostate cancer.    I PSS 10/3 He has had a couple incidences of urinary urgency when he had to hold his urine back longer than expected.  This has been new in the last few months.  Patient denies any modifying or aggravating factors.  Patient denies any recent UTI's, gross hematuria, dysuria or suprapubic/flank pain.  Patient denies any fevers, chills, nausea or vomiting.    IPSS     Row Name 12/24/22 1000         International Prostate Symptom Score   How often have you had the sensation of not emptying your bladder? Less than 1 in 5     How often have you had to urinate less than every two hours? Less than half the time     How often have you found you stopped and started again several times when you urinated? Less than 1 in 5 times     How often have you found it difficult to postpone urination? Less than half the time     How often have you had a weak urinary stream? About half the time     How often have you had to strain to start urination? Not at All     How many times did you typically get up at night to urinate? 1 Time     Total IPSS Score 10        Quality of Life due to urinary symptoms   If you were to spend the rest of your life with your urinary condition just the way it is now how would you feel about that? Mixed               Score:  1-7 Mild 8-19 Moderate 20-35 Severe    SHIM 20  Patient still having spontaneous erections.  He denies any pain or curvature with erections.  He has noticed that he has not been able to maintain erections as long as he had in the past.     SHIM     Row Name 12/24/22 1055         SHIM: Over the last 6 months:   How do you rate your confidence that you could get and keep an erection? Moderate     When you had erections with sexual stimulation, how often were your erections hard enough for penetration (entering your partner)? Almost Always or Always     During sexual intercourse, how often were you able to maintain your erection after you had penetrated (entered) your partner? Almost Always or Always     During sexual intercourse, how difficult was it to maintain  your erection to completion of intercourse? Slightly Difficult     When you attempted sexual intercourse, how often was it satisfactory for you? Sometimes (about half the time)       SHIM Total Score   SHIM 20               Score: 1-7 Severe ED 8-11 Moderate ED 12-16 Mild-Moderate ED 17-21 Mild ED 22-25 No ED   PMH: Past Medical History:  Diagnosis Date   GERD (gastroesophageal reflux disease)    Kidney stone    Migraine headache    1-2x/month   Urolithiasis     Surgical History: Past Surgical History:  Procedure Laterality Date   CERVICAL DISC ARTHROPLASTY     C6/7   COLONOSCOPY WITH PROPOFOL N/A 03/19/2022   Procedure: COLONOSCOPY WITH BIOPSY;  Surgeon: Midge Minium, MD;  Location[facility];  Service: Endoscopy;  Laterality: N/A;   EXTRACORPOREAL SHOCK WAVE LITHOTRIPSY Left 09/27/2019   Procedure: EXTRACORPOREAL SHOCK WAVE LITHOTRIPSY (ESWL);  Surgeon: Sondra Come, MD;  Location: [facility];  Service: Urology;  Laterality: Left;   hemorrhiordectomy  2012   LITHOTRIPSY  2017   NASAL SEPTUM SURGERY  2014   NASAL SINUS SURGERY  2016   POLYPECTOMY N/A 03/19/2022   Procedure: POLYPECTOMY;  Surgeon: Midge Minium, MD;  [facility]: [facility];  Service: Endoscopy;  Laterality: N/A;   URETEROSCOPY      Home Medications:  Allergies as of 12/24/2022   No Known Allergies      Medication List        Accurate as of December 24, 2022 11:59 PM. If you have any questions, ask your nurse or doctor.          STOP taking these medications    fluticasone 50 MCG/ACT nasal spray Commonly known as: FLONASE Stopped by: Michiel Cowboy       TAKE these medications    aspirin-acetaminophen-caffeine 250-250-65 MG tablet Commonly known as: EXCEDRIN MIGRAINE Take by mouth every 6 (six) hours as needed for headache.   omeprazole 20 MG tablet Commonly known as: PRILOSEC OTC Take 20 mg by mouth as needed.   tadalafil 5 MG tablet Commonly known as: CIALIS Take 1 tablet (5 mg total) by mouth daily as needed for erectile dysfunction. Started by: Michiel Cowboy        Allergies: No Known Allergies  Family History: Family History  Problem Relation Age of Onset   Diabetes Mother    Alcohol abuse Father    Arthritis Father    Hypertension Father    Emphysema Sister    Cancer Brother 66       prostate Cancer   Colon cancer Maternal Grandmother    Stroke Maternal Grandmother    Hyperlipidemia Maternal Grandfather     Social History:  reports that he has never smoked. He has never used smokeless tobacco. He reports current alcohol use. He reports that he does not use drugs.  ROS: Pertinent ROS in HPI  Physical Exam: BP 125/80   Pulse 60   Wt 239 lb 3.2 oz (108.5 kg)   BMI 31.56 kg/m   Constitutional:  Well nourished. Alert and oriented, No acute distress. HEENT: [place] AT, moist mucus membranes.  Trachea midline, no masses. Cardiovascular: No clubbing,  cyanosis, or edema. Respiratory: Normal respiratory effort, no increased work of breathing. GI: Abdomen is soft, non tender, non distended, no abdominal masses. Liver and spleen not palpable.  No hernias appreciated.  Stool sample for  occult testing is not indicated.   GU: No CVA tenderness.  No bladder fullness or masses.  Patient with uncircumcised phallus.  Urethral meatus is patent.  No penile discharge. No penile lesions or rashes. Scrotum without lesions, cysts, rashes and/or edema.  Testicles are located scrotally bilaterally. No masses are appreciated in the testicles. Left and right epididymis are normal. Rectal: Patient with  normal sphincter tone. Anus and perineum without scarring or rashes. No rectal masses are appreciated. Prostate is approximately 50 + grams, flat, could only palpate the apex and the midportion of the gland, no nodules are appreciated. Seminal vesicles could not be palpated.  Neurologic: Grossly intact, no focal deficits, moving all 4 extremities. Psychiatric: Normal mood and affect.  Laboratory Data: Lab Results  Component Value Date   WBC 4.8 01/26/2022   HGB 15.5 01/26/2022   HCT 45.8 01/26/2022   MCV 90.9 01/26/2022   PLT 190.0 01/26/2022   Lab Results  Component Value Date   CREATININE 0.92 06/10/2022   Lab Results  Component Value Date   PSA 2.40 01/26/2022    Lab Results  Component Value Date   HGBA1C 5.5 01/26/2022   Lab Results  Component Value Date   TSH 1.16 01/26/2022      Component Value Date/Time   CHOL 168 06/10/2022 0934   HDL 51 06/10/2022 0934   CHOLHDL 3.3 06/10/2022 0934   VLDL 17.8 01/26/2022 1156   LDLCALC 102 (H) 06/10/2022 0934    Lab Results  Component Value Date   AST 26 06/10/2022   Lab Results  Component Value Date   ALT 38 06/10/2022   Urinalysis    Component Value Date/Time   COLORURINE YELLOW 06/10/2022 0933   APPEARANCEUR CLEAR 06/10/2022 0933   LABSPEC 1.020 06/10/2022 0933   PHURINE 6.0  06/10/2022 0933   GLUCOSEU NEGATIVE 06/10/2022 0933   HGBUR NEGATIVE 06/10/2022 0933   BILIRUBINUR NEGATIVE 06/10/2022 0933   KETONESUR NEGATIVE 06/10/2022 0933   PROTEINUR 30 (A) 09/18/2019 0825   UROBILINOGEN 0.2 06/10/2022 0933   NITRITE NEGATIVE 06/10/2022 0933   LEUKOCYTESUR NEGATIVE 06/10/2022 0933  I have reviewed the labs.   Pertinent Imaging: N/A  Assessment & Plan:    1. BPH with LUTS -PSA stable, but will recheck today to ensure an upward trend does not continue -DRE benign  -most bothersome symptoms are urgency -continue conservative management, avoiding bladder irritants and timed voiding's -start Cialis 5 mg daily  2. Erectile dysfunction -  We discussed trying a PDE5 inhibitor (Cialis 5 mg daily) to help with his BPH symptoms as well as his ED issues   Return for Pending lab result .  These notes generated with voice recognition software. I apologize for typographical errors.  Cloretta Ned  [facility] [address]  [address] [place], [name] [address] [phone]

## 2022-12-24 ENCOUNTER — Encounter: Payer: Self-pay | Admitting: Urology

## 2022-12-24 ENCOUNTER — Ambulatory Visit: Payer: 59 | Admitting: Urology

## 2022-12-24 VITALS — BP 125/80 | HR 60 | Wt 239.2 lb

## 2022-12-24 DIAGNOSIS — N529 Male erectile dysfunction, unspecified: Secondary | ICD-10-CM

## 2022-12-24 DIAGNOSIS — N401 Enlarged prostate with lower urinary tract symptoms: Secondary | ICD-10-CM | POA: Diagnosis not present

## 2022-12-24 DIAGNOSIS — N138 Other obstructive and reflux uropathy: Secondary | ICD-10-CM

## 2022-12-24 MED ORDER — TADALAFIL 5 MG PO TABS: 5.0000 mg | ORAL_TABLET | Freq: Every day | ORAL | 3 refills | Status: AC | PRN

## 2023-02-21 ENCOUNTER — Ambulatory Visit: Payer: 59 | Admitting: Surgery

## 2023-02-21 ENCOUNTER — Encounter: Payer: Self-pay | Admitting: Surgery

## 2023-02-21 VITALS — BP 133/89 | HR 81 | Temp 98.0°F | Ht 73.0 in | Wt 240.0 lb

## 2023-02-21 DIAGNOSIS — K4020 Bilateral inguinal hernia, without obstruction or gangrene, not specified as recurrent: Secondary | ICD-10-CM | POA: Diagnosis not present

## 2023-02-21 DIAGNOSIS — K429 Umbilical hernia without obstruction or gangrene: Secondary | ICD-10-CM | POA: Diagnosis not present

## 2023-02-21 NOTE — Patient Instructions (Signed)
Follow-up with our office as needed.  Please call and ask to speak with a nurse if you develop questions or concerns.   Hernia, Adult     A hernia happens when an organ or tissue inside your body pushes out through a weak spot in the muscles of your belly. This makes a bulge. The bulge may be: In a scar from surgery. This type of bulge is called an incisional hernia. Near your belly button. This type is called an umbilical hernia. In your groin, which is the area where your leg meets your lower belly. This type is called an inguinal hernia. If you're male, this type could also be in your scrotum. In your upper thigh. This type is called a femoral hernia. Inside your belly. This type is called a hiatal hernia. It happens when your stomach slides above your diaphragm, which is the muscle between your belly and your chest. What are the causes? You may get a hernia if: You lift heavy things. You cough over a long period of time. You have trouble pooping, also called constipation. Trouble pooping can lead to straining. There's a cut from surgery in your belly. You have a problem that's present at birth. There's fluid around your belly. You're male and have a testicle that hasn't moved down into your scrotum. You may be at greater risk for hernia if: You smoke. You're very overweight. What are the signs or symptoms? The main symptom is a bulge, but the bulge may not always be seen. It may grow bigger or be easier to see when you cough or strain, such as when you lift something heavy. If you can push the bulge back into your belly, it may not cause pain. If you can't push it back into your belly, it may lose its blood supply. This can cause: Pain. Fever. Nausea and vomiting. Swelling. Trouble pooping. How is this diagnosed? A hernia may be diagnosed based on your symptoms, medical history, and an exam. Your health care provider may ask you to cough or move in a way that makes the bulge  easier to see. You may also have tests done. These may include: X-rays. Ultrasound. CT scan. How is this treated? A hernia that's small and painless may not need to be treated. A hernia that's large or painful may be treated with surgery. Surgery involves pushing the bulge back into place and fixing the weak area of the muscle or belly. Follow these instructions at home: Activity Try not to strain. You may have to avoid lifting. Ask how much weight you can safely lift. If you lift something heavy, use your leg muscles. Do not use your back muscles to lift. Prevent trouble pooping You may need to take these actions to prevent or treat trouble pooping: Drink enough fluid to keep your pee (urine) pale yellow. Take medicines to help you poop. Eat foods high in fiber. These include beans, whole grains, and fresh fruits and vegetables. General instructions When you cough, try to cough gently. Try to push the bulge back in by very gently pressing on it when you're lying down. Do not try to force it back in if it won't push in easily. If you're overweight, work with your provider to lose weight safely. Do not smoke, vape, or use products with nicotine or tobacco in them. If you need help quitting, talk with your provider. If you're going to have surgery, watch your hernia for changes in shape, size, or color. Tell your  provider about any changes. Contact a health care provider if: You get new pain, swelling, or redness near your hernia. You have trouble pooping. Your poop is harder or larger than normal. You have a fever or chills. You have nausea or vomiting. Your hernia can't be pushed in. Get help right away if: You have belly pain that gets worse. Your hernia: Changes in shape or size. Changes color. Feels hard or hurts when you touch it. These symptoms may be an emergency. Call 911 right away. Do not wait to see if the symptoms will go away. Do not drive yourself to the  hospital. This information is not intended to replace advice given to you by your health care provider. Make sure you discuss any questions you have with your health care provider. Document Revised: 08/03/2022 Document Reviewed: 08/03/2022 Elsevier Patient Education  2024 [employer].

## 2023-02-23 ENCOUNTER — Encounter: Payer: Self-pay | Admitting: Surgery

## 2023-02-23 NOTE — Progress Notes (Signed)
Patient ID: Seth Stone, male   DOB: [birth year], 52 y.o.   MRN: [id number]  HPI Seth Stone is a 52 y.o. male seen in consultation at the request of Dr. Darrick Huntsman for right inguinal hernia and umbilical hernia.  He reports that some occasional discomfort and pain when exercising around the umbilicus and also right inguinal area.  The pain is mild dull.  He did have a CT for kidney stones that I personally reviewed showing evidence of a bilateral inguinal defect as well as an umbilical defect. 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 He is able to perform more than 6 METS of activity without any shortness of breath or chest pain.  I did see him about 4 years ago and at the time he wanted to hold off on surgical intervention.  Since then he got married and now has 2 kids.  He is also interested in a vasectomy There are no other acute abnormalities. CBC and BMP were completely normal. He is Hispanic born in the [place] from [place] parents   HPI  Past Medical History:  Diagnosis Date   GERD (gastroesophageal reflux disease)    Kidney stone    Migraine headache    1-2x/month   Urolithiasis     Past Surgical History:  Procedure Laterality Date   CERVICAL DISC ARTHROPLASTY     C6/7   COLONOSCOPY WITH PROPOFOL N/A 03/19/2022   Procedure: COLONOSCOPY WITH BIOPSY;  Surgeon: Midge Minium, MD;  Location: [facility];  Service: Endoscopy;  Laterality: N/A;   EXTRACORPOREAL SHOCK WAVE LITHOTRIPSY Left 09/27/2019   Procedure: EXTRACORPOREAL SHOCK WAVE LITHOTRIPSY (ESWL);  Surgeon: Sondra Come, MD;  Location: [facility];  Service: Urology;  Laterality: Left;   hemorrhiordectomy  2012   LITHOTRIPSY  2017   NASAL SEPTUM SURGERY  2014   NASAL SINUS SURGERY  2016   POLYPECTOMY N/A 03/19/2022   Procedure: POLYPECTOMY;  Surgeon: Midge Minium, MD;  Location: [facility];  Service: Endoscopy;  Laterality: N/A;   URETEROSCOPY      Family History   Problem Relation Age of Onset   Diabetes Mother    Alcohol abuse Father    Arthritis Father    Hypertension Father    Emphysema Sister    Cancer Brother 16       prostate Cancer   Colon cancer Maternal Grandmother    Stroke Maternal Grandmother    Hyperlipidemia Maternal Grandfather     Social History Social History   Tobacco Use   Smoking status: Never    Passive exposure: Never   Smokeless tobacco: Never  Vaping Use   Vaping status: Never Used  Substance Use Topics   Alcohol use: Yes    Comment: rare   Drug use: No    No Known Allergies  Current Outpatient Medications  Medication Sig Dispense Refill   aspirin-acetaminophen-caffeine (EXCEDRIN MIGRAINE) 250-250-65 MG tablet Take by mouth every 6 (six) hours as needed for headache.     omeprazole (PRILOSEC OTC) 20 MG tablet Take 20 mg by mouth as needed.     tadalafil (CIALIS) 5 MG tablet Take 1 tablet (5 mg total) by mouth daily as needed for erectile dysfunction. 90 tablet 3   No current facility-administered medications for this visit.     Review of Systems Full ROS  was asked and was negative except for the information on the HPI  Physical Exam Blood pressure 133/89, pulse 81, temperature 98  F (36.7 C), height 6\' 1"  (1.854 m), weight 240 lb (108.9 kg), SpO2 99%. CONSTITUTIONAL: NAD. EYES: Pupils are equal, round, Sclera are non-icteric. EARS, NOSE, MOUTH AND THROAT: The oropharynx is clear. The oral mucosa is pink and moist. Hearing is intact to voice. LYMPH NODES:  Lymph nodes in the neck are normal. RESPIRATORY:  Lungs are clear. There is normal respiratory effort, with equal breath sounds bilaterally, and without pathologic use of accessory muscles. CARDIOVASCULAR: Heart is regular without murmurs, gallops, or rubs. GI: The abdomen is  soft, nontender, and nondistended. There are no palpable masses. Umbilical hernia 1.5 cm and reducible bilateral inguinal hernias. There is no hepatosplenomegaly. There are  normal bowel sounds in all quadrants. GU: Rectal deferred.   MUSCULOSKELETAL: Normal muscle strength and tone. No cyanosis or edema.   SKIN: Turgor is good and there are no pathologic skin lesions or ulcers. NEUROLOGIC: Motor and sensation is grossly normal. Cranial nerves are grossly intact. PSYCH:  Oriented to person, place and time. Affect is normal.  Data Reviewed  I have personally reviewed the patient's imaging, laboratory findings and medical records.    Assessment/Plan 52 year old male with minimal symptomatic right inguinal hernia and asymptomatic left inguinal hernia with also minimal symptomatic umbilical hernia.  I had an extensive discussion with the patient regarding his disease process.  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  Currently he is interested in a vasectomy and wishes to discuss with GU in detail about procedure.  He is thinking about getting a vasectomy done first and then revisit the topic for bilateral inguinal hernias as well as umbilical hernia repair during the same operative setting at a later stage.  At this time I do not necessarily recommend any major restrictions as this has not necessarily proven to have an adverse outcomes.  He will have restrictions if he chooses to have surgical intervention and this are typically required for 6 weeks. Please note I spent 55 minutes in this encounter including extensive counseling, personal review of record and imaging studies, placing orders and performing documentation.  A copy of this report was sent to the referring provider  Sterling Big, MD FACS General Surgeon 02/23/2023, 6:56 PM

## 2023-02-28 ENCOUNTER — Ambulatory Visit: Payer: 59 | Admitting: Surgery

## 2023-03-01 ENCOUNTER — Encounter: Payer: Self-pay | Admitting: Urology

## 2023-03-01 ENCOUNTER — Ambulatory Visit: Payer: 59 | Admitting: Urology

## 2023-03-01 VITALS — BP 145/87 | HR 69 | Ht 73.0 in | Wt 242.0 lb

## 2023-03-01 DIAGNOSIS — Z3009 Encounter for other general counseling and advice on contraception: Secondary | ICD-10-CM | POA: Diagnosis not present

## 2023-03-01 MED ORDER — DIAZEPAM 5 MG PO TABS: 5.0000 mg | ORAL_TABLET | Freq: Once | ORAL | 0 refills | Status: DC | PRN

## 2023-03-01 NOTE — Patient Instructions (Signed)
 Pre-Vasectomy Instructions  STOP all aspirin or blood thinners (Aspirin, Plavix, Coumadin, Warfarin, Motrin, Ibuprofen, Advil, Aleve, Naproxen, Naprosyn) for 7 days prior to the procedure.  If you have any questions about stopping these medications please contact your primary care physician or cardiologist.  Shave all hair from the upper scrotum on the day of the procedure.  This means just under the penis onto the scrotal sac.  The area shaved should measure about 2-3 inches around.  You may lather the scrotum with soap and water, and shave with a safety razor.  After shaving the area, thoroughly wash the penis and the scrotum, then shower or bathe to remove all the loose hairs.  If needed, wash the area again just before coming in for your Vasectomy.  It is recommended to have a light meal an hour or so prior to the procedure.  Bring a scrotal support (jock strap or suspensory, or tight jockey shorts or underwear).  Wear comfortable pants or shorts.  While the actual procedure usually takes about 45 minutes, you should be prepared to stay in the office for approximately one hour.  Bring someone with you to drive you home.  If you have any questions or concerns, please feel free to call the office at [phone].  Vasectomy Vasectomy is a procedure to cut and then tie or burn the ends of the vas deferens. The vas deferens is a tube that carries sperm from the testicle to the urethra. This procedure blocks sperm from being released during sex. This ensures that sperm does not go into the vagina. A vasectomy does not affect your ability to have sex or your desire for sex. Also, it does not prevent sexually transmitted infections, or STIs. Vasectomy is a permanent and effective form of birth control. You should have a vasectomy only when you and your partner are sure you do not want children in the future. Do not get this procedure when you are stressed, such as after divorce or pregnancy  loss. Tell a health care provider about: Any allergies you have. All medicines you are taking. These include vitamins, herbs, eye drops, creams, and over-the-counter medicines. Any problems you or family members have had with anesthesia. Any bleeding problems you have. Any surgeries you have had. Any medical conditions you have. What are the risks? Your provider will talk with you about risks. These may include: Infection. Bleeding and swelling of the scrotum. The scrotum is the sac that contains the testicles. Allergies to medicines. Failure of the procedure to prevent pregnancy. There is a very small chance that the tied or burned parts of the vas deferens may reconnect. If this happens, you could still make a person pregnant. Pain in the scrotum that goes on after you heal from the procedure. What happens before the procedure? Medicines Ask your health care provider about: Changing or stopping your regular medicines. These include any diabetes medicines or blood thinners you take. Taking medicines such as aspirin and ibuprofen. These medicines can thin your blood. Do not take them unless your provider tells you to take them. Taking over-the-counter medicines, vitamins, herbs, and supplements. You may be told to take a sedative a few hours before the procedure. A sedative helps you relax. Surgery safety Ask your provider: How your surgery site will be marked. What steps will be taken to help prevent infection. These steps may include: Removing hair at the surgery site. Washing skin with a soap that kills germs. Taking antibiotics. General instructions Do  not use any products that contain nicotine or tobacco for at least 4 weeks before the procedure. These products include cigarettes, chewing tobacco, and vaping devices, such as e-cigarettes. If you need help quitting, ask your provider. If you'll be going home right after the procedure, plan to have a responsible adult: Take you  home from the hospital or clinic. You'll not be allowed to drive. Care for you for the time you are told. What happens during the procedure?  You may be given: A sedative. This helps you relax. You may also be told to take this a few hours before the procedure. Anesthesia. This keeps you from feeling pain. It will numb certain areas of your body. Your provider will feel for your vas deferens. To get to the vas deferens, your provider may: Make a very small cut, or incision, in your scrotum. Make a hole by piercing the scrotum. Your vas deferens will be pulled out of your scrotum and cut. To close it, the cut ends of the vas deferens will be tied or burned. The vas deferens will be put back into your scrotum. The cut or the hole in the scrotum will be closed with stitches. The stitches will dissolve and will not need to be removed. The procedure will be done again on the other side of your scrotum. The procedure may vary among providers and hospitals. What happens after the procedure? You will be monitored to make sure that you do not have problems. You will be asked not to ejaculate for at least 1 week after the procedure, or for as long as you are told. You will need to use another form of birth control for 2-4 months after the procedure. Do this until your provider confirms that there's no sperm in your semen. You may be given something to wear to support your scrotum. This includes a jockstrap or underwear with a pouch. If you were given a sedative during your procedure, do not drive or use machines until your provider says that it's safe. This information is not intended to replace advice given to you by your health care provider. Make sure you discuss any questions you have with your health care provider. Document Revised: 07/13/2022 Document Reviewed: 07/13/2022 Elsevier Patient Education  2024 [employer].

## 2023-03-01 NOTE — Progress Notes (Signed)
03/01/2023 3:49 PM   [employer] 18-Oct-1970 [id number]  Reason for visit: Discuss vasectomy  HPI: 52 year old male with history of nephrolithiasis, mild BPH and ED on Cialis managed by Michiel Cowboy is interested in vasectomy for permanent sterilization.  He has 2 young children and not interested in further pregnancies.  On exam, testicles 20 cc and descended bilaterally without masses, vas deferens palpable bilaterally.  We discussed the risks and benefits of vasectomy at length.  Vasectomy is intended to be a permanent form of contraception, and does not produce immediate sterility.  Following vasectomy another form of contraception is required until vas occlusion is confirmed by a post-vasectomy semen analysis obtained 2-3 months after the procedure.  Even after vas occlusion is confirmed, vasectomy is not 100% reliable in preventing pregnancy, and the failure rate is approximately 05/1998.  Repeat vasectomy is required in less than 1% of patients.  He should refrain from ejaculation for 1 week after vasectomy.  Options for fertility after vasectomy include vasectomy reversal, and sperm retrieval with in vitro fertilization or ICSI.  These options are not always successful and may be expensive.  Finally, there are other permanent and non-permanent alternatives to vasectomy available. There is no risk of erectile dysfunction, and the volume of semen will be similar to prior, as the majority of the ejaculate is from the prostate and seminal vesicles.   The procedure takes ~20 minutes.  We recommend patients take 5-10 mg of Valium 30 minutes prior, and he will need a driver post-procedure.  Local anesthetic is injected into the scrotal skin and a small segment of the vas deferens is removed, and the ends occluded. The complication rate is approximately 1-2%, and includes bleeding, infection, and development of chronic scrotal pain.  PLAN: Schedule vasectomy Valium sent to  pharmacy   Sondra Come, MD  [facility] [address], [address] [place], [name] [address] [phone]

## 2023-03-22 ENCOUNTER — Encounter: Payer: Self-pay | Admitting: Urology

## 2023-03-22 ENCOUNTER — Ambulatory Visit (INDEPENDENT_AMBULATORY_CARE_PROVIDER_SITE_OTHER): Payer: 59 | Admitting: Urology

## 2023-03-22 VITALS — BP 144/89 | HR 75 | Ht 73.0 in | Wt 242.0 lb

## 2023-03-22 DIAGNOSIS — Z302 Encounter for sterilization: Secondary | ICD-10-CM

## 2023-03-22 NOTE — Progress Notes (Signed)
VASECTOMY PROCEDURE NOTE:  The patient was taken to the minor procedure room and placed in the supine position. His genitals were prepped and draped in the usual sterile fashion. The right vas deferens was brought up to the skin of the right upper scrotum. The skin overlying it was anesthetized with 1% lidocaine without epinephrine, anesthetic was also injected alongside the vas deferens in the direction of the inguinal canal. The no scalpel vasectomy instrument was used to make a small perforation in the scrotal skin. The vasectomy clamp was used to grasp the vas deferens. It was carefully dissected free from surrounding structures. A 1cm segment of the vas was removed, and the cut ends of the mucosa were cauterized. No significant bleeding was noted. The vas deferens was returned to the scrotum. The skin incision was closed with a simple interrupted stitch of 4-0 chromic.  Attention was then turned to the left side. The left vasectomy was performed in the same exact fashion. Sterile dressings were placed over each incision. The patient tolerated the procedure well.  IMPRESSION/DIAGNOSIS: The patient is a 52 year old gentleman who underwent a vasectomy today. Post-procedure instructions were reviewed. I stressed the importance of continuing to use birth control until he provides a semen specimen more than 2 months from now that demonstrates azoospermia.  We discussed return precautions including fever over 101, significant bleeding or hematoma, or uncontrolled pain. I also stressed the importance of avoiding strenuous activity for one week, no sexual activity or ejaculations for 5 days, intermittent icing over the next 48 hours, and scrotal support.   PLAN: The patient will be advised of his semen analysis results when available.  Legrand Rams, MD 03/22/2023

## 2023-03-22 NOTE — Patient Instructions (Signed)

## 2023-04-05 ENCOUNTER — Encounter: Payer: 59 | Admitting: Urology

## 2023-05-02 ENCOUNTER — Telehealth: Payer: Self-pay | Admitting: Surgery

## 2023-05-02 ENCOUNTER — Encounter: Payer: Self-pay | Admitting: Surgery

## 2023-05-02 ENCOUNTER — Ambulatory Visit: Payer: 59 | Admitting: Surgery

## 2023-05-02 VITALS — BP 125/77 | HR 60 | Temp 98.5°F | Ht 73.0 in | Wt 238.0 lb

## 2023-05-02 DIAGNOSIS — K429 Umbilical hernia without obstruction or gangrene: Secondary | ICD-10-CM

## 2023-05-02 DIAGNOSIS — K4020 Bilateral inguinal hernia, without obstruction or gangrene, not specified as recurrent: Secondary | ICD-10-CM

## 2023-05-02 NOTE — Patient Instructions (Signed)
You have chose to have your hernias repaired. This will be done by Dr. Everlene Farrier  Please see your (blue) Pre-care information that you have been given today. Our surgery scheduler will call you to verify surgery date and to go over information.   You will need to arrange to be out of work for approximately 1-2 weeks and then you may return with a lifting restriction for 4 more weeks. If you have FMLA or Disability paperwork that needs to be filled out, please have your company fax your paperwork to [phone] or you may drop this by either office. This paperwork will be filled out within 3 days after your surgery has been completed.  You may have a bruise in your groin and also swelling and brusing in your testicle area. You may use ice 4-5 times daily for 15-20 minutes each time. Make sure that you place a barrier between you and the ice pack. To decrease the swelling, you may roll up a bath towel and place it vertically in between your thighs with your testicles resting on the towel. You will want to keep this area elevated as much as possible for several days following surgery.  Umbilical Hernia, Adult  A hernia is a lump of tissue that pushes through an opening in the muscles. An umbilical hernia happens in the belly, near the belly button. The hernia may contain tissues from the small or large intestine. It may also have fatty tissue that covers the intestines. Umbilical hernias in adults may get worse over time. They need to be treated with surgery. There are several types of umbilical hernias. They include: Indirect hernia. This occurs just above or below the belly button. It's the most common type of umbilical hernia in adults. Direct hernia. This type occurs in an opening that's formed by the belly button. Reducible hernia. This hernia comes and goes. You may see it only when you strain, cough, or lift something heavy. This type of hernia can be pushed back into the belly  (reduced). Incarcerated hernia. This traps the hernia in the wall of the belly. This type of hernia can't be pushed back into the belly. It can cause a strangulated hernia. Strangulated hernia. This hernia cuts off blood flow to the tissues inside the hernia. The tissues can die if this happens. This type of hernia must be treated right away. What are the causes? An umbilical hernia happens when tissue inside the belly pushes through an opening in the muscles of the belly. What increases the risk? You're more likely to get this hernia if: You strain while lifting or pushing heavy objects. You've had several pregnancies. You have a condition that puts pressure on your belly, and you've had it for a long time. These include: Obesity. A buildup of fluid inside your belly. Vomiting or coughing all the time. Trouble pooping (constipation). You've had surgery that weakened the muscles in the belly. What are the signs or symptoms? The main symptom of this condition is a bulge at the belly button or near it. The bulge does not cause pain. Other symptoms depend on the type of hernia you have. A reducible hernia may be seen only when you strain, cough, or lift something heavy. Other symptoms may include: Dull pain. A feeling of pressure. An incarcerated hernia may cause very bad pain. Also, you may: Vomit or feel like you may vomit. Not be able to pass gas. A strangulated hernia may cause: Pain that gets worse and worse.  Vomiting, or feeling like you may vomit. Pain when you press on the hernia. Change of color on the skin over the hernia. The skin may become red or purple. Trouble pooping. Blood in the poop. How is this diagnosed? This condition may be diagnosed based on: Your symptoms and medical history. A physical exam. You may be asked to cough or strain while standing. These actions will put pressure inside your belly. The pressure can force the hernia through the opening in your  muscles. Your health care provider may try to push the hernia back into your belly (reduce). How is this treated? Surgery is the only treatment for an umbilical hernia. Surgery for a strangulated hernia must be done right away. If you have a small hernia that's not incarcerated, you may need to lose weight before the surgery is done. Follow these instructions at home: Managing constipation You may need to take these actions to prevent trouble pooping. This will help to prevent straining. Drink enough fluid to keep your pee (urine) pale yellow. Take over-the-counter or prescription medicines. Eat foods that are high in fiber, such as beans, whole grains, and fresh fruits and vegetables. Limit foods that are high in fat and sugars, such as fried or sweet foods. General instructions Do not try to push the hernia back in. Lose weight, if told by your provider. Watch your hernia for any changes in color or size. Tell your provider if any changes occur. You may need to avoid activities that put pressure on your hernia. You may have to avoid lifting. Ask your provider how much you can safely lift. Take over-the-counter and prescription medicines only as told by your provider. Contact a health care provider if: Your hernia gets larger or feels hard. Your hernia becomes painful. You get a fever or chills. Get help right away if: You get very bad pain near the area of the hernia, and the pain comes on suddenly. You have pain and you vomit or feel like you may vomit. The skin over your hernia changes color. These symptoms may be an emergency. Get help right away. Call 911. Do not wait to see if the symptoms go away. Do not drive yourself to the hospital. This information is not intended to replace advice given to you by your health care provider. Make sure you discuss any questions you have with your health care provider. Document Revised: 08/31/2022 Document Reviewed: 08/31/2022 Elsevier  Patient Education  2024 Elsevier Inc[employer]  at [facility].    Inguinal Hernia, Adult Muscles help keep everything in the body in its proper place. But if a weak spot in the muscles develops, something can poke through. That is called a hernia. When this happens in the lower part of the belly (abdomen), it is called an inguinal hernia. (It takes its name from a part of the body in this region called the inguinal canal.) A weak spot in the wall of muscles lets some fat or part of the small intestine bulge through. An inguinal hernia can develop at any age. Men get them more often than women. CAUSES  In adults, an inguinal hernia develops over time. It can be triggered by: Suddenly straining the muscles of the lower abdomen. Lifting heavy objects. Straining to have a bowel movement. Difficult bowel movements (constipation) can lead to this. Constant coughing. This may be caused by smoking or lung disease. Being overweight. Being pregnant. Working at a job that requires long periods of standing or heavy lifting. Having  had an inguinal hernia before. One type can be an emergency situation. It is called a strangulated inguinal hernia. It develops if part of the small intestine slips through the weak spot and cannot get back into the abdomen. The blood supply can be cut off. If that happens, part of the intestine may die. This situation requires emergency surgery. SYMPTOMS  Often, a small inguinal hernia has no symptoms. It is found when a healthcare provider does a physical exam. Larger hernias usually have symptoms.  In adults, symptoms may include: A lump in the groin. This is easier to see when the person is standing. It might disappear when lying down. In men, a lump in the scrotum. Pain or burning in the groin. This occurs especially when lifting, straining or coughing. A dull ache or feeling of pressure in the groin. Signs of a strangulated hernia can include: A bulge in the groin that  becomes very painful and tender to the touch. A bulge that turns red or purple. Fever, nausea and vomiting. Inability to have a bowel movement or to pass gas. DIAGNOSIS  To decide if you have an inguinal hernia, a healthcare provider will probably do a physical examination. This will include asking questions about any symptoms you have noticed. The healthcare provider might feel the groin area and ask you to cough. If an inguinal hernia is felt, the healthcare provider may try to slide it back into the abdomen. Usually no other tests are needed. TREATMENT  Treatments can vary. The size of the hernia makes a difference. Options include: Watchful waiting. This is often suggested if the hernia is small and you have had no symptoms. No medical procedure will be done unless symptoms develop. You will need to watch closely for symptoms. If any occur, contact your healthcare provider right away. Surgery. This is used if the hernia is larger or you have symptoms. Open surgery. This is usually an outpatient procedure (you will not stay overnight in a hospital). An cut (incision) is made through the skin in the groin. The hernia is put back inside the abdomen. The weak area in the muscles is then repaired by herniorrhaphy or hernioplasty. Herniorrhaphy: in this type of surgery, the weak muscles are sewn back together. Hernioplasty: a patch or mesh is used to close the weak area in the abdominal wall. Laparoscopy. In this procedure, a surgeon makes small incisions. A thin tube with a tiny video camera (called a laparoscope) is put into the abdomen. The surgeon repairs the hernia with mesh by looking with the video camera and using two long instruments. HOME CARE INSTRUCTIONS  After surgery to repair an inguinal hernia: You will need to take pain medicine prescribed by your healthcare provider. Follow all directions carefully. You will need to take care of the wound from the incision. Your activity will be  restricted for awhile. This will probably include no heavy lifting for several weeks. You also should not do anything too active for a few weeks. When you can return to work will depend on the type of job that you have. During "watchful waiting" periods, you should: Maintain a healthy weight. Eat a diet high in fiber (fruits, vegetables and whole grains). Drink plenty of fluids to avoid constipation. This means drinking enough water and other liquids to keep your urine clear or pale yellow. Do not lift heavy objects. Do not stand for long periods of time. Quit smoking. This should keep you from developing a frequent cough. SEEK MEDICAL  CARE IF:  A bulge develops in your groin area. You feel pain, a burning sensation or pressure in the groin. This might be worse if you are lifting or straining. You develop a fever of more than 100.5 F (38.1 C). SEEK IMMEDIATE MEDICAL CARE IF:  Pain in the groin increases suddenly. A bulge in the groin gets bigger suddenly and does not go down. For men, there is sudden pain in the scrotum. Or, the size of the scrotum increases. A bulge in the groin area becomes red or purple and is painful to touch. You have nausea or vomiting that does not go away. You feel your heart beating much faster than normal. You cannot have a bowel movement or pass gas. You develop a fever of more than 102.0 F (38.9 C).   This information is not intended to replace advice given to you by your health care provider. Make sure you discuss any questions you have with your health care provider.   Document Released: 09/26/2008 Document Revised: 08/02/2011 Document Reviewed: 11/11/2014 Elsevier Interactive Patient Education [employer]

## 2023-05-02 NOTE — Telephone Encounter (Signed)
Patient calls back, he is now informed of all dates regarding surgery.

## 2023-05-02 NOTE — H&P (View-Only) (Signed)
 Outpatient Surgical Follow Up  05/02/2023  Seth Stone is an 52 y.o. male.   Chief Complaint  Patient presents with   Follow-up    HPI:  Seth Stone is a 52 y.o. male seen in f/u for right inguinal hernia and umbilical hernia.  He reports that some occasional discomfort and pain when exercising around the umbilicus and also right inguinal area.  The pain is mild dull.  He did have a CT for kidney stones that I personally reviewed showing evidence of a bilateral inguinal defect as well as an umbilical defect. 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  I did see him about 4 years ago and at the time he wanted to hold off on surgical intervention.  Since then he got married and now has 2 kids.  He recently underwent  a vasectomy There are no other acute abnormalities. CBC and BMP were completely normal. He is Hispanic born in the [place] from [place] parents  Past Medical History:  Diagnosis Date   GERD (gastroesophageal reflux disease)    Kidney stone    Migraine headache    1-2x/month   Urolithiasis     Past Surgical History:  Procedure Laterality Date   CERVICAL DISC ARTHROPLASTY     C6/7   COLONOSCOPY WITH PROPOFOL N/A 03/19/2022   Procedure: COLONOSCOPY WITH BIOPSY;  Surgeon: Midge Minium, MD;  Location: [facility];  Service: Endoscopy;  Laterality: N/A;   EXTRACORPOREAL SHOCK WAVE LITHOTRIPSY Left 09/27/2019   Procedure: EXTRACORPOREAL SHOCK WAVE LITHOTRIPSY (ESWL);  Surgeon: Sondra Come, MD;  Location: [facility];  Service: Urology;  Laterality: Left;   hemorrhiordectomy  2012   LITHOTRIPSY  2017   NASAL SEPTUM SURGERY  2014   NASAL SINUS SURGERY  2016   POLYPECTOMY N/A 03/19/2022   Procedure: POLYPECTOMY;  Surgeon: Midge Minium, MD;  Location: [facility];  Service: Endoscopy;  Laterality: N/A;    URETEROSCOPY      Family History  Problem Relation Age of Onset   Diabetes Mother    Alcohol abuse Father    Arthritis Father    Hypertension Father    Emphysema Sister    Cancer Brother 70       prostate Cancer   Colon cancer Maternal Grandmother    Stroke Maternal Grandmother    Hyperlipidemia Maternal Grandfather     Social History:  reports that he has never smoked. He has never been exposed to tobacco smoke. He has never used smokeless tobacco. He reports current alcohol use. He reports that he does not use drugs.  Allergies: No Known Allergies  Medications reviewed.    ROS Full ROS performed and is otherwise negative other than what is stated in HPI   BP 125/77   Pulse 60   Temp 98.5 F (36.9 C)   Ht 6\' 1"  (1.854 m)   Wt 238 lb (108 kg)   SpO2 99%   BMI 31.40 kg/m   Physical Exam  CONSTITUTIONAL: NAD. EYES: Pupils are equal, round, Sclera are non-icteric. EARS, NOSE, MOUTH AND THROAT: The oropharynx is clear. The oral mucosa is pink and moist. Hearing is intact to voice. LYMPH NODES:  Lymph nodes in the neck are normal. RESPIRATORY:  Lungs are clear. There is normal respiratory effort, with equal breath sounds bilaterally, and without  pathologic use of accessory muscles. CARDIOVASCULAR: Heart is regular without murmurs, gallops, or rubs. GI: The abdomen is  soft, nontender, and nondistended. There are no palpable masses. Umbilical hernia 2 cm chronically incarcerated and reducible bilateral inguinal hernias. There is no hepatosplenomegaly. There are normal bowel sounds in all quadrants. GU: Rectal deferred.   MUSCULOSKELETAL: Normal muscle strength and tone. No cyanosis or edema.   SKIN: Turgor is good and there are no pathologic skin lesions or ulcers. NEUROLOGIC: Motor and sensation is grossly normal. Cranial nerves are grossly intact. PSYCH:  Oriented to person, place and time. Affect is normal.   Assessment/Plan: 52 year old male with  bilateral  inguinal hernia and a symptomatic umbilical hernia. He is now ready to have the hernias fixed.  I do think that we can do the 3 hernias with an open umbilical hernia repair and bilateral robotic inguinal hernia repair.  Procedure discussed with the patient in detail.  Risk, benefits and possible complications including but not limited to: infection, chronic pain, recurrence, intra-abdominal injuries. Also d/w pt about weight lifting restrictions after surgery. HE understands and wishes to proceed. HE wishes to have the procedure Stone before the ned of the year.  I spent 40 minutes in this encounter including personally reviewing imaging studies, medical records, placing orders, counseling the patient and performing documentation   Sterling Big, MD [facility] General Surgeon

## 2023-05-02 NOTE — Progress Notes (Signed)
Outpatient Surgical Follow Up  05/02/2023  Seth Stone is an 52 y.o. male.   Chief Complaint  Patient presents with   Follow-up    HPI:  Seth Stone is a 52 y.o. male seen in f/u for right inguinal hernia and umbilical hernia.  He reports that some occasional discomfort and pain when exercising around the umbilicus and also right inguinal area.  The pain is mild dull.  He did have a CT for kidney stones that I personally reviewed showing evidence of a bilateral inguinal defect as well as an umbilical defect. 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  I did see him about 4 years ago and at the time he wanted to hold off on surgical intervention.  Since then he got married and now has 2 kids.  He recently underwent  a vasectomy There are no other acute abnormalities. CBC and BMP were completely normal. He is Hispanic born in the [place] from [place] parents  Past Medical History:  Diagnosis Date   GERD (gastroesophageal reflux disease)    Kidney stone    Migraine headache    1-2x/month   Urolithiasis     Past Surgical History:  Procedure Laterality Date   CERVICAL DISC ARTHROPLASTY     C6/7   COLONOSCOPY WITH PROPOFOL N/A 03/19/2022   Procedure: COLONOSCOPY WITH BIOPSY;  Surgeon: Midge Minium, MD;  Location: [facility];  Service: Endoscopy;  Laterality: N/A;   EXTRACORPOREAL SHOCK WAVE LITHOTRIPSY Left 09/27/2019   Procedure: EXTRACORPOREAL SHOCK WAVE LITHOTRIPSY (ESWL);  Surgeon: Sondra Come, MD;  Location: [facility];  Service: Urology;  Laterality: Left;   hemorrhiordectomy  2012   LITHOTRIPSY  2017   NASAL SEPTUM SURGERY  2014   NASAL SINUS SURGERY  2016   POLYPECTOMY N/A 03/19/2022   Procedure: POLYPECTOMY;  Surgeon: Midge Minium, MD;  Location: [facility];  Service: Endoscopy;  Laterality: N/A;    URETEROSCOPY      Family History  Problem Relation Age of Onset   Diabetes Mother    Alcohol abuse Father    Arthritis Father    Hypertension Father    Emphysema Sister    Cancer Brother 70       prostate Cancer   Colon cancer Maternal Grandmother    Stroke Maternal Grandmother    Hyperlipidemia Maternal Grandfather     Social History:  reports that he has never smoked. He has never been exposed to tobacco smoke. He has never used smokeless tobacco. He reports current alcohol use. He reports that he does not use drugs.  Allergies: No Known Allergies  Medications reviewed.    ROS Full ROS performed and is otherwise negative other than what is stated in HPI   BP 125/77   Pulse 60   Temp 98.5 F (36.9 C)   Ht 6\' 1"  (1.854 m)   Wt 238 lb (108 kg)   SpO2 99%   BMI 31.40 kg/m   Physical Exam  CONSTITUTIONAL: NAD. EYES: Pupils are equal, round, Sclera are non-icteric. EARS, NOSE, MOUTH AND THROAT: The oropharynx is clear. The oral mucosa is pink and moist. Hearing is intact to voice. LYMPH NODES:  Lymph nodes in the neck are normal. RESPIRATORY:  Lungs are clear. There is normal respiratory effort, with equal breath sounds bilaterally, and without  pathologic use of accessory muscles. CARDIOVASCULAR: Heart is regular without murmurs, gallops, or rubs. GI: The abdomen is  soft, nontender, and nondistended. There are no palpable masses. Umbilical hernia 2 cm chronically incarcerated and reducible bilateral inguinal hernias. There is no hepatosplenomegaly. There are normal bowel sounds in all quadrants. GU: Rectal deferred.   MUSCULOSKELETAL: Normal muscle strength and tone. No cyanosis or edema.   SKIN: Turgor is good and there are no pathologic skin lesions or ulcers. NEUROLOGIC: Motor and sensation is grossly normal. Cranial nerves are grossly intact. PSYCH:  Oriented to person, place and time. Affect is normal.   Assessment/Plan: 52 year old male with  bilateral  inguinal hernia and a symptomatic umbilical hernia. He is now ready to have the hernias fixed.  I do think that we can do the 3 hernias with an open umbilical hernia repair and bilateral robotic inguinal hernia repair.  Procedure discussed with the patient in detail.  Risk, benefits and possible complications including but not limited to: infection, chronic pain, recurrence, intra-abdominal injuries. Also d/w pt about weight lifting restrictions after surgery. HE understands and wishes to proceed. HE wishes to have the procedure Stone before the ned of the year.  I spent 40 minutes in this encounter including personally reviewing imaging studies, medical records, placing orders, counseling the patient and performing documentation   Sterling Big, MD [facility] General Surgeon

## 2023-05-02 NOTE — Telephone Encounter (Signed)
Left message for patient to call, please inform him of the following regarding scheduled surgery with Dr. Everlene Farrier.   Pre-Admission date/time, and Surgery date at [facility].  Surgery Date: 05/10/23 Preadmission Testing Date: 05/04/23 (phone 8a-1p)  Also patient will need to call at [phone], between 1-3:00pm the day before surgery, to find out what time to arrive for surgery.

## 2023-05-04 ENCOUNTER — Encounter
Admission: RE | Admit: 2023-05-04 | Discharge: 2023-05-04 | Disposition: A | Discharge status: Home / Self Care | Payer: 59 | Source: Ambulatory Visit | Attending: Surgery | Admitting: Surgery

## 2023-05-04 DIAGNOSIS — Z01810 Encounter for preprocedural cardiovascular examination: Secondary | ICD-10-CM

## 2023-05-04 DIAGNOSIS — Z01812 Encounter for preprocedural laboratory examination: Secondary | ICD-10-CM

## 2023-05-04 DIAGNOSIS — I700 Atherosclerosis of aorta: Secondary | ICD-10-CM

## 2023-05-04 HISTORY — DX: Hypertrophy of nasal turbinates: J34.3

## 2023-05-04 HISTORY — DX: Radiculopathy, cervical region: M54.12

## 2023-05-04 HISTORY — DX: Benign neoplasm of cecum: D12.0

## 2023-05-04 HISTORY — DX: Atherosclerosis of aorta: I70.0

## 2023-05-04 HISTORY — DX: Deficiency of other specified B group vitamins: E53.8

## 2023-05-04 HISTORY — DX: Vitamin D deficiency, unspecified: E55.9

## 2023-05-04 HISTORY — DX: Bilateral inguinal hernia, without obstruction or gangrene, not specified as recurrent: K40.20

## 2023-05-04 HISTORY — DX: Obesity, unspecified: E66.9

## 2023-05-04 HISTORY — DX: Other shoulder lesions, right shoulder: M75.81

## 2023-05-04 HISTORY — DX: Male erectile dysfunction, unspecified: N52.9

## 2023-05-04 HISTORY — DX: Umbilical hernia without obstruction or gangrene: K42.9

## 2023-05-04 NOTE — Patient Instructions (Addendum)
Your procedure is scheduled on:05-10-23 Tuesday Report to the Registration Desk on the 1st floor of the [facility].Then proceed to the 2nd floor Surgery Desk To find out your arrival time, please call [phone] between 1PM - 3PM on:05-09-23 Monday If your arrival time is 6:00 am, do not arrive before that time as the [facility] entrance doors do not open until 6:00 am.  REMEMBER: Instructions that are not followed completely may result in serious medical risk, up to and including death; or upon the discretion of your surgeon and anesthesiologist your surgery may need to be rescheduled.  Do not eat food after midnight the night before surgery.  No gum chewing or hard candies.  You may however, drink CLEAR liquids up to 2 hours before you are scheduled to arrive for your surgery. Do not drink anything within 2 hours of your scheduled arrival time.  Clear liquids include: - water  - apple juice without pulp - gatorade (not RED colors) - black coffee or tea (Do NOT add milk or creamers to the coffee or tea) Do NOT drink anything that is not on this list.  One week prior to surgery:Stop NOW (05-04-23) Stop Anti-inflammatories (NSAIDS) such as Advil, Aleve, Ibuprofen, Motrin, Naproxen, Naprosyn and Aspirin based products such as Excedrin Migraine, Goody's Powder, BC Powder. Stop ANY OVER THE COUNTER supplements until after surgery (Calcium)  You may however, continue to take Tylenol if needed for pain up until the day of surgery  Stop tadalafil (CIALIS) 2 days prior to surgery-Last dose will be on 05-07-23 Saturday  Continue taking all of your other prescription medications up until the day of surgery.  Do NOT take any medication the day of surgery  No Alcohol for 24 hours before or after surgery.  No Smoking including e-cigarettes for 24 hours before surgery.  No chewable tobacco products for at least 6 hours before surgery.  No nicotine patches on the day of surgery.  Do  not use any "recreational" drugs for at least a week (preferably 2 weeks) before your surgery.  Please be advised that the combination of cocaine and anesthesia may have negative outcomes, up to and including death. If you test positive for cocaine, your surgery will be cancelled.  On the morning of surgery brush your teeth with toothpaste and water, you may rinse your mouth with mouthwash if you wish. Do not swallow any toothpaste or mouthwash.  Use CHG Soap as directed on instruction sheet.  Do not wear jewelry, make-up, hairpins, clips or nail polish.  For welded (permanent) jewelry: bracelets, anklets, waist bands, etc.  Please have this removed prior to surgery.  If it is not removed, there is a chance that hospital personnel will need to cut it off on the day of surgery.  Do not wear lotions, powders, or perfumes.   Do not shave body hair from the neck down 48 hours before surgery.  Contact lenses, hearing aids and dentures may not be worn into surgery.  Do not bring valuables to the hospital. [facility] is not responsible for any missing/lost belongings or valuables.   Notify your doctor if there is any change in your medical condition (cold, fever, infection).  Wear comfortable clothing (specific to your surgery type) to the hospital.  After surgery, you can help prevent lung complications by doing breathing exercises.  Take deep breaths and cough every 1-2 hours. Your doctor may order a device called an Incentive Spirometer to help you take deep breaths. When  coughing or sneezing, hold a pillow firmly against your incision with both hands. This is called "splinting." Doing this helps protect your incision. It also decreases belly discomfort.  If you are being admitted to the hospital overnight, leave your suitcase in the car. After surgery it may be brought to your room.  In case of increased patient census, it may be necessary for you, the patient, to continue your  postoperative care in the Same Day Surgery department.  If you are being discharged the day of surgery, you will not be allowed to drive home. You will need a responsible individual to drive you home and stay with you for 24 hours after surgery.   If you are taking public transportation, you will need to have a responsible individual with you.  Please call the Pre-admissions Testing Dept. at [phone] if you have any questions about these instructions.  Surgery Visitation Policy:  Patients having surgery or a procedure may have two visitors.  Children under the age of 51 must have an adult with them who is not the patient.     Preparing for Surgery with CHLORHEXIDINE GLUCONATE (CHG) Soap  Chlorhexidine Gluconate (CHG) Soap  o An antiseptic cleaner that kills germs and bonds with the skin to continue killing germs even after washing  o Used for showering the night before surgery and morning of surgery  Before surgery, you can play an important role by reducing the number of germs on your skin.  CHG (Chlorhexidine gluconate) soap is an antiseptic cleanser which kills germs and bonds with the skin to continue killing germs even after washing.  Please do not use if you have an allergy to CHG or antibacterial soaps. If your skin becomes reddened/irritated stop using the CHG.  1. Shower the NIGHT BEFORE SURGERY and the MORNING OF SURGERY with CHG soap.  2. If you choose to wash your hair, wash your hair first as usual with your normal shampoo.  3. After shampooing, rinse your hair and body thoroughly to remove the shampoo.  4. Use CHG as you would any other liquid soap. You can apply CHG directly to the skin and wash gently with a scrungie or a clean washcloth.  5. Apply the CHG soap to your body only from the neck down. Do not use on open wounds or open sores. Avoid contact with your eyes, ears, mouth, and genitals (private parts). Wash face and genitals (private parts) with  your normal soap.  6. Wash thoroughly, paying special attention to the area where your surgery will be performed.  7. Thoroughly rinse your body with warm water.  8. Do not shower/wash with your normal soap after using and rinsing off the CHG soap.  9. Pat yourself dry with a clean towel.  10. Wear clean pajamas to bed the night before surgery.  12. Place clean sheets on your bed the night of your first shower and do not sleep with pets.  13. Shower again with the CHG soap on the day of surgery prior to arriving at the hospital.  14. Do not apply any deodorants/lotions/powders.  15. Please wear clean clothes to the hospital.

## 2023-05-06 ENCOUNTER — Encounter
Admission: RE | Admit: 2023-05-06 | Discharge: 2023-05-06 | Disposition: A | Discharge status: Home / Self Care | Payer: 59 | Source: Ambulatory Visit | Attending: Surgery | Admitting: Surgery

## 2023-05-06 DIAGNOSIS — Z01818 Encounter for other preprocedural examination: Secondary | ICD-10-CM | POA: Insufficient documentation

## 2023-05-06 DIAGNOSIS — I700 Atherosclerosis of aorta: Secondary | ICD-10-CM | POA: Insufficient documentation

## 2023-05-06 DIAGNOSIS — Z01812 Encounter for preprocedural laboratory examination: Secondary | ICD-10-CM

## 2023-05-06 DIAGNOSIS — Z01810 Encounter for preprocedural cardiovascular examination: Secondary | ICD-10-CM

## 2023-05-06 LAB — CBC
HCT: 43.6 % (ref 39.0–52.0)
Hemoglobin: 14.7 g/dL (ref 13.0–17.0)
MCH: 30.1 pg (ref 26.0–34.0)
MCHC: 33.7 g/dL (ref 30.0–36.0)
MCV: 89.3 fL (ref 80.0–100.0)
Platelets: 196 10*3/uL (ref 150–400)
RBC: 4.88 MIL/uL (ref 4.22–5.81)
RDW: 12.4 % (ref 11.5–15.5)
WBC: 4.1 10*3/uL (ref 4.0–10.5)
nRBC: 0 % (ref 0.0–0.2)

## 2023-05-06 LAB — BASIC METABOLIC PANEL
Anion gap: 6 (ref 5–15)
BUN: 19 mg/dL (ref 6–20)
CO2: 26 mmol/L (ref 22–32)
Calcium: 8.7 mg/dL — ABNORMAL LOW (ref 8.9–10.3)
Chloride: 103 mmol/L (ref 98–111)
Creatinine, Ser: 0.82 mg/dL (ref 0.61–1.24)
GFR, Estimated: >60 mL/min (ref >60)
Glucose, Bld: 89 mg/dL (ref 70–99)
Potassium: 3.8 mmol/L (ref 3.5–5.1)
Sodium: 135 mmol/L (ref 135–145)

## 2023-05-09 MED ORDER — CHLORHEXIDINE GLUCONATE CLOTH 2 % EX PADS: 6.0000 | MEDICATED_PAD | Freq: Once | CUTANEOUS | Status: DC

## 2023-05-09 MED ORDER — CHLORHEXIDINE GLUCONATE 0.12 % MT SOLN
15.0000 mL | Freq: Once | OROMUCOSAL | Status: AC
  Administered 2023-05-10: 15 mL via OROMUCOSAL

## 2023-05-09 MED ORDER — GABAPENTIN 300 MG PO CAPS
300.0000 mg | ORAL_CAPSULE | ORAL | Status: AC
  Administered 2023-05-10: 300 mg via ORAL

## 2023-05-09 MED ORDER — CEFAZOLIN SODIUM-DEXTROSE 2-4 GM/100ML-% IV SOLN
2.0000 g | INTRAVENOUS | Status: AC
  Administered 2023-05-10: 2 g via INTRAVENOUS

## 2023-05-09 MED ORDER — ORAL CARE MOUTH RINSE: 15.0000 mL | Freq: Once | OROMUCOSAL | Status: AC

## 2023-05-09 MED ORDER — LACTATED RINGERS IV SOLN: INTRAVENOUS | Status: DC

## 2023-05-09 MED ORDER — ACETAMINOPHEN 500 MG PO TABS
1000.0000 mg | ORAL_TABLET | ORAL | Status: AC
  Administered 2023-05-10: 1000 mg via ORAL

## 2023-05-09 MED ORDER — CELECOXIB 200 MG PO CAPS
200.0000 mg | ORAL_CAPSULE | ORAL | Status: AC
  Administered 2023-05-10: 200 mg via ORAL

## 2023-05-10 ENCOUNTER — Other Ambulatory Visit: Payer: Self-pay

## 2023-05-10 ENCOUNTER — Ambulatory Visit
Admission: RE | Admit: 2023-05-10 | Discharge: 2023-05-10 | Disposition: A | Discharge status: Home / Self Care | Payer: 59 | Attending: Surgery | Admitting: Surgery

## 2023-05-10 ENCOUNTER — Encounter
Admission: RE | Disposition: A | Discharge status: Home / Self Care | Payer: Self-pay | Source: Home / Self Care | Attending: Surgery

## 2023-05-10 ENCOUNTER — Ambulatory Visit: Payer: 59 | Admitting: Anesthesiology

## 2023-05-10 ENCOUNTER — Encounter: Payer: Self-pay | Admitting: Surgery

## 2023-05-10 DIAGNOSIS — Z8719 Personal history of other diseases of the digestive system: Secondary | ICD-10-CM | POA: Diagnosis present

## 2023-05-10 DIAGNOSIS — K4020 Bilateral inguinal hernia, without obstruction or gangrene, not specified as recurrent: Secondary | ICD-10-CM | POA: Diagnosis present

## 2023-05-10 DIAGNOSIS — K420 Umbilical hernia with obstruction, without gangrene: Secondary | ICD-10-CM | POA: Diagnosis present

## 2023-05-10 HISTORY — PX: UMBILICAL HERNIA REPAIR: SHX196

## 2023-05-10 HISTORY — PX: INSERTION OF MESH: SHX5868

## 2023-05-10 SURGERY — REPAIR, HERNIA, INGUINAL, BILATERAL, ROBOT-ASSISTED
Anesthesia: General | Site: Inguinal

## 2023-05-10 MED ORDER — DEXAMETHASONE SODIUM PHOSPHATE 10 MG/ML IJ SOLN
INTRAMUSCULAR | Status: AC
  Filled 2023-05-10: qty 1

## 2023-05-10 MED ORDER — MIDAZOLAM HCL 2 MG/2ML IJ SOLN
INTRAMUSCULAR | Status: DC | PRN
  Administered 2023-05-10: 2 mg via INTRAVENOUS

## 2023-05-10 MED ORDER — HYDROMORPHONE HCL 1 MG/ML IJ SOLN
INTRAMUSCULAR | Status: AC
  Filled 2023-05-10: qty 1

## 2023-05-10 MED ORDER — ACETAMINOPHEN 500 MG PO TABS
ORAL_TABLET | ORAL | Status: AC
  Filled 2023-05-10: qty 2

## 2023-05-10 MED ORDER — BUPIVACAINE-EPINEPHRINE 0.25% -1:200000 IJ SOLN
INTRAMUSCULAR | Status: DC | PRN
  Administered 2023-05-10: 30 mL

## 2023-05-10 MED ORDER — OXYCODONE HCL 5 MG PO TABS
5.0000 mg | ORAL_TABLET | Freq: Once | ORAL | Status: AC | PRN
  Administered 2023-05-10: 5 mg via ORAL

## 2023-05-10 MED ORDER — ONDANSETRON HCL 4 MG/2ML IJ SOLN
INTRAMUSCULAR | Status: AC
  Filled 2023-05-10: qty 2

## 2023-05-10 MED ORDER — OXYCODONE HCL 5 MG PO TABS
ORAL_TABLET | ORAL | Status: AC
  Filled 2023-05-10: qty 1

## 2023-05-10 MED ORDER — CHLORHEXIDINE GLUCONATE 0.12 % MT SOLN
OROMUCOSAL | Status: AC
  Filled 2023-05-10: qty 15

## 2023-05-10 MED ORDER — LIDOCAINE HCL (CARDIAC) PF 100 MG/5ML IV SOSY
PREFILLED_SYRINGE | INTRAVENOUS | Status: DC | PRN
  Administered 2023-05-10: 100 mg via INTRAVENOUS

## 2023-05-10 MED ORDER — BUPIVACAINE LIPOSOME 1.3 % IJ SUSP
INTRAMUSCULAR | Status: AC
  Filled 2023-05-10: qty 20

## 2023-05-10 MED ORDER — MIDAZOLAM HCL 2 MG/2ML IJ SOLN
INTRAMUSCULAR | Status: AC
  Filled 2023-05-10: qty 2

## 2023-05-10 MED ORDER — PROPOFOL 10 MG/ML IV BOLUS
INTRAVENOUS | Status: DC | PRN
  Administered 2023-05-10: 200 mg via INTRAVENOUS

## 2023-05-10 MED ORDER — CEFAZOLIN SODIUM-DEXTROSE 2-4 GM/100ML-% IV SOLN
INTRAVENOUS | Status: AC
  Filled 2023-05-10: qty 100

## 2023-05-10 MED ORDER — DEXAMETHASONE SODIUM PHOSPHATE 10 MG/ML IJ SOLN
INTRAMUSCULAR | Status: DC | PRN
  Administered 2023-05-10: 10 mg via INTRAVENOUS

## 2023-05-10 MED ORDER — KETAMINE HCL 50 MG/5ML IJ SOSY
PREFILLED_SYRINGE | INTRAMUSCULAR | Status: AC
  Filled 2023-05-10: qty 5

## 2023-05-10 MED ORDER — FENTANYL CITRATE (PF) 100 MCG/2ML IJ SOLN
INTRAMUSCULAR | Status: AC
  Filled 2023-05-10: qty 2

## 2023-05-10 MED ORDER — CELECOXIB 200 MG PO CAPS
ORAL_CAPSULE | ORAL | Status: AC
  Filled 2023-05-10: qty 1

## 2023-05-10 MED ORDER — EPHEDRINE SULFATE-NACL 50-0.9 MG/10ML-% IV SOSY
PREFILLED_SYRINGE | INTRAVENOUS | Status: DC | PRN
  Administered 2023-05-10: 10 mg via INTRAVENOUS

## 2023-05-10 MED ORDER — KETAMINE HCL 50 MG/5ML IJ SOSY
PREFILLED_SYRINGE | INTRAMUSCULAR | Status: DC | PRN
  Administered 2023-05-10: 40 mg via INTRAVENOUS

## 2023-05-10 MED ORDER — EPHEDRINE 5 MG/ML INJ
INTRAVENOUS | Status: AC
  Filled 2023-05-10: qty 5

## 2023-05-10 MED ORDER — OXYCODONE HCL 5 MG/5ML PO SOLN: 5.0000 mg | Freq: Once | ORAL | Status: AC | PRN

## 2023-05-10 MED ORDER — GABAPENTIN 300 MG PO CAPS
ORAL_CAPSULE | ORAL | Status: AC
  Filled 2023-05-10: qty 1

## 2023-05-10 MED ORDER — ROCURONIUM BROMIDE 10 MG/ML (PF) SYRINGE
PREFILLED_SYRINGE | INTRAVENOUS | Status: AC
  Filled 2023-05-10: qty 10

## 2023-05-10 MED ORDER — HYDROCODONE-ACETAMINOPHEN 5-325 MG PO TABS: 1.0000 | ORAL_TABLET | ORAL | 0 refills | Status: DC | PRN

## 2023-05-10 MED ORDER — PROPOFOL 10 MG/ML IV BOLUS
INTRAVENOUS | Status: AC
  Filled 2023-05-10: qty 20

## 2023-05-10 MED ORDER — ONDANSETRON HCL 4 MG/2ML IJ SOLN
INTRAMUSCULAR | Status: DC | PRN
  Administered 2023-05-10: 4 mg via INTRAVENOUS

## 2023-05-10 MED ORDER — BUPIVACAINE-EPINEPHRINE (PF) 0.25% -1:200000 IJ SOLN
INTRAMUSCULAR | Status: AC
  Filled 2023-05-10: qty 30

## 2023-05-10 MED ORDER — HYDROMORPHONE HCL 1 MG/ML IJ SOLN
0.2500 mg | INTRAMUSCULAR | Status: DC | PRN
  Administered 2023-05-10 (×2): 0.5 mg via INTRAVENOUS

## 2023-05-10 MED ORDER — ROCURONIUM BROMIDE 100 MG/10ML IV SOLN
INTRAVENOUS | Status: DC | PRN
  Administered 2023-05-10: 80 mg via INTRAVENOUS
  Administered 2023-05-10: 20 mg via INTRAVENOUS

## 2023-05-10 MED ORDER — BUPIVACAINE LIPOSOME 1.3 % IJ SUSP
INTRAMUSCULAR | Status: DC | PRN
  Administered 2023-05-10: 20 mL

## 2023-05-10 MED ORDER — SUGAMMADEX SODIUM 200 MG/2ML IV SOLN
INTRAVENOUS | Status: DC | PRN
  Administered 2023-05-10: 400 mg via INTRAVENOUS

## 2023-05-10 MED ORDER — FENTANYL CITRATE (PF) 100 MCG/2ML IJ SOLN
INTRAMUSCULAR | Status: DC | PRN
  Administered 2023-05-10: 100 ug via INTRAVENOUS

## 2023-05-10 MED ORDER — LIDOCAINE HCL (PF) 2 % IJ SOLN
INTRAMUSCULAR | Status: AC
  Filled 2023-05-10: qty 5

## 2023-05-10 SURGICAL SUPPLY — 57 items
APPLIER CLIP 11 MED OPEN (CLIP)
BLADE CLIPPER SURG (BLADE) ×3 IMPLANT
BLADE SURG 15 STRL LF DISP TIS (BLADE) ×3 IMPLANT
CANNULA REDUCER 12-8 DVNC XI (CANNULA) ×3 IMPLANT
CHLORAPREP W/TINT 26 (MISCELLANEOUS) ×3 IMPLANT
CLIP APPLIE 11 MED OPEN (CLIP) ×3 IMPLANT
COVER TIP SHEARS 8 DVNC (MISCELLANEOUS) ×3 IMPLANT
COVER WAND RF STERILE (DRAPES) ×3 IMPLANT
DERMABOND ADVANCED .7 DNX12 (GAUZE/BANDAGES/DRESSINGS) ×3 IMPLANT
DRAPE ARM DVNC X/XI (DISPOSABLE) ×9 IMPLANT
DRAPE COLUMN DVNC XI (DISPOSABLE) ×3 IMPLANT
DRAPE INCISE IOBAN 66X45 STRL (DRAPES) ×3 IMPLANT
DRAPE LAPAROTOMY 77X122 PED (DRAPES) ×3 IMPLANT
DRSG TELFA 3X8 NADH STRL (GAUZE/BANDAGES/DRESSINGS) ×3 IMPLANT
ELECT CAUTERY BLADE 6.4 (BLADE) ×3 IMPLANT
ELECT REM PT RETURN 9FT ADLT (ELECTROSURGICAL) ×3
ELECTRODE REM PT RTRN 9FT ADLT (ELECTROSURGICAL) ×3 IMPLANT
FORCEPS BPLR R/ABLATION 8 DVNC (INSTRUMENTS) ×3 IMPLANT
FORCEPS PROGRASP DVNC XI (FORCEP) ×3 IMPLANT
GAUZE 4X4 16PLY ~~LOC~~+RFID DBL (SPONGE) ×3 IMPLANT
GLOVE BIO SURGEON STRL SZ7 (GLOVE) ×12 IMPLANT
GOWN STRL REUS W/ TWL LRG LVL3 (GOWN DISPOSABLE) ×12 IMPLANT
IRRIGATION STRYKERFLOW (MISCELLANEOUS) ×3 IMPLANT
IRRIGATOR STRYKERFLOW (MISCELLANEOUS)
IV NS 1000ML BAXH (IV SOLUTION) IMPLANT
KIT PINK PAD W/HEAD ARE REST (MISCELLANEOUS) ×3
KIT PINK PAD W/HEAD ARM REST (MISCELLANEOUS) ×3 IMPLANT
LABEL OR SOLS (LABEL) ×3 IMPLANT
MANIFOLD NEPTUNE II (INSTRUMENTS) ×3 IMPLANT
MESH 3DMAX 4X6 LT LRG (Mesh General) IMPLANT
MESH 3DMAX 4X6 RT LRG (Mesh General) IMPLANT
NDL DRIVE SUT CUT DVNC (INSTRUMENTS) ×3 IMPLANT
NDL HYPO 22X1.5 SAFETY MO (MISCELLANEOUS) ×3 IMPLANT
NEEDLE DRIVE SUT CUT DVNC (INSTRUMENTS) ×3
NEEDLE HYPO 22X1.5 SAFETY MO (MISCELLANEOUS) ×3
NS IRRIG 500ML POUR BTL (IV SOLUTION) ×3 IMPLANT
OBTURATOR OPTICAL STND 8 DVNC (TROCAR) ×3
OBTURATOR OPTICALSTD 8 DVNC (TROCAR) ×3 IMPLANT
PACK BASIN MINOR ARMC (MISCELLANEOUS) ×3 IMPLANT
PACK LAP CHOLECYSTECTOMY (MISCELLANEOUS) ×3 IMPLANT
SCISSORS MNPLR CVD DVNC XI (INSTRUMENTS) ×3 IMPLANT
SEAL UNIV 5-12 XI (MISCELLANEOUS) ×6 IMPLANT
SET TUBE SMOKE EVAC HIGH FLOW (TUBING) ×3 IMPLANT
SOL ELECTROSURG ANTI STICK (MISCELLANEOUS) ×3
SOLUTION ELECTROSURG ANTI STCK (MISCELLANEOUS) ×3 IMPLANT
SPONGE T-LAP 18X18 ~~LOC~~+RFID (SPONGE) ×3 IMPLANT
SUT ETHIBOND NAB MO 7 #0 18IN (SUTURE) ×3 IMPLANT
SUT MNCRL AB 4-0 PS2 18 (SUTURE) ×3 IMPLANT
SUT STRATA 3-0 SH (SUTURE) ×6 IMPLANT
SUT VIC AB 2-0 SH 27XBRD (SUTURE) IMPLANT
SUT VIC AB 3-0 SH 27X BRD (SUTURE) ×3 IMPLANT
SUT VICRYL 0 UR6 27IN ABS (SUTURE) ×6 IMPLANT
SYR 20ML LL LF (SYRINGE) ×3 IMPLANT
SYR 30ML LL (SYRINGE) ×3 IMPLANT
TAPE TRANSPORE STRL 2 31045 (GAUZE/BANDAGES/DRESSINGS) ×3 IMPLANT
TRAP FLUID SMOKE EVACUATOR (MISCELLANEOUS) ×3 IMPLANT
WATER STERILE IRR 500ML POUR (IV SOLUTION) ×3 IMPLANT

## 2023-05-10 NOTE — Anesthesia Procedure Notes (Signed)
Procedure Name: Intubation Date/Time: 05/10/2023 10:21 AM  Performed by: Elisabeth Pigeon, CRNAPre-anesthesia Checklist: Patient identified, Patient being monitored, Timeout performed, Emergency Drugs available and Suction available Patient Re-evaluated:Patient Re-evaluated prior to induction Oxygen Delivery Method: Circle system utilized Preoxygenation: Pre-oxygenation with 100% oxygen Induction Type: IV induction Ventilation: Mask ventilation without difficulty Laryngoscope Size: Mac, McGrath and 4 Grade View: Grade I Tube type: Oral Tube size: 7.5 mm Number of attempts: 1 Airway Equipment and Method: Stylet Placement Confirmation: ETT inserted through vocal cords under direct vision, positive ETCO2 and breath sounds checked- equal and bilateral Secured at: 23 cm Tube secured with: Tape Dental Injury: Teeth and Oropharynx as per pre-operative assessment

## 2023-05-10 NOTE — Anesthesia Preprocedure Evaluation (Signed)
Anesthesia Evaluation  Patient identified by MRN, date of birth, ID band Patient awake    Reviewed: Allergy & Precautions, NPO status , Patient's Chart, lab work & pertinent test results  History of Anesthesia Complications Negative for: history of anesthetic complications  Airway Mallampati: II  TM Distance: >3 FB Neck ROM: full    Dental no notable dental hx.    Pulmonary neg pulmonary ROS   Pulmonary exam normal        Cardiovascular negative cardio ROS Normal cardiovascular exam     Neuro/Psych  Headaches  Neuromuscular disease  negative psych ROS   GI/Hepatic Neg liver ROS,GERD  Medicated,,  Endo/Other  negative endocrine ROS    Renal/GU      Musculoskeletal   Abdominal   Peds  Hematology negative hematology ROS (+)   Anesthesia Other Findings Past Medical History: No date: Aortic atherosclerosis (HCC) No date: B12 deficiency No date: Benign neoplasm of cecum No date: Cervical radiculopathy No date: ED (erectile dysfunction) No date: GERD (gastroesophageal reflux disease) No date: Inguinal hernia, bilateral No date: Kidney stone No date: Migraine headache     Comment:  1-2x/month No date: Nasal turbinate hypertrophy No date: Obesity (BMI 30-39.9) No date: Rotator cuff tendonitis, right No date: Umbilical hernia No date: Urolithiasis No date: Vitamin D deficiency  Past Surgical History: No date: CERVICAL DISC ARTHROPLASTY     Comment:  C6/7 03/19/2022: COLONOSCOPY WITH PROPOFOL; N/A     Comment:  Procedure: COLONOSCOPY WITH BIOPSY;  Surgeon: Midge Minium, MD;  Location: [facility];  Service:               Endoscopy;  Laterality: N/A; 09/27/2019: EXTRACORPOREAL SHOCK WAVE LITHOTRIPSY; Left     Comment:  Procedure: EXTRACORPOREAL SHOCK WAVE LITHOTRIPSY (ESWL);              Surgeon: Sondra Come, MD;  Location: [facility];                Service: Urology;  Laterality:  Left; 2012: hemorrhiordectomy 2017: LITHOTRIPSY 2014: NASAL SEPTUM SURGERY 2016: NASAL SINUS SURGERY 03/19/2022: POLYPECTOMY; N/A     Comment:  Procedure: POLYPECTOMY;  Surgeon: Midge Minium, MD;                Location: [facility];  Service: Endoscopy;                Laterality: N/A; No date: URETEROSCOPY  BMI    Body Mass Index: 31.40 kg/m      Reproductive/Obstetrics negative OB ROS                             Anesthesia Physical Anesthesia Plan  ASA: 2  Anesthesia Plan: General ETT   Post-op Pain Management: Tylenol PO (pre-op)*, Gabapentin PO (pre-op)* and Celebrex PO (pre-op)*   Induction: Intravenous  PONV Risk Score and Plan: 2 and Ondansetron, Dexamethasone, Midazolam and Treatment may vary due to age or medical condition  Airway Management Planned: Oral ETT  Additional Equipment:   Intra-op Plan:   Post-operative Plan: Extubation in OR  Informed Consent: I have reviewed the patients History and Physical, chart, labs and discussed the procedure including the risks, benefits and alternatives for the proposed anesthesia with the patient or authorized representative who has indicated his/her understanding and acceptance.     Dental Advisory Given  Plan Discussed  with: Anesthesiologist, CRNA and Surgeon  Anesthesia Plan Comments: (Patient consented for risks of anesthesia including but not limited to:  - adverse reactions to medications - damage to eyes, teeth, lips or other oral mucosa - nerve damage due to positioning  - sore throat or hoarseness - Damage to heart, brain, nerves, lungs, other parts of body or loss of life  Patient voiced understanding and assent.)       Anesthesia Quick Evaluation

## 2023-05-10 NOTE — Anesthesia Postprocedure Evaluation (Signed)
Anesthesia Post Note  Patient: Seth Stone  Procedure(s) Performed: XI ROBOTIC ASSISTED BILATERAL INGUINAL HERNIA (Bilateral) HERNIA REPAIR UMBILICAL ADULT INSERTION OF MESH (Bilateral: Inguinal)  Patient location during evaluation: PACU Anesthesia Type: General Level of consciousness: awake and alert Pain management: pain level controlled Vital Signs Assessment: post-procedure vital signs reviewed and stable Respiratory status: spontaneous breathing, nonlabored ventilation, respiratory function stable and patient connected to nasal cannula oxygen Cardiovascular status: blood pressure returned to baseline and stable Postop Assessment: no apparent nausea or vomiting Anesthetic complications: no   No notable events documented.   Last Vitals:  Vitals:   05/10/23 1300 05/10/23 1306  BP: 134/82 128/77  Pulse: 73 75  Resp: 14 15  Temp: 36.7 C   SpO2: 100% 99%    Last Pain:  Vitals:   05/10/23 1306  PainSc: 5                  Louie Boston

## 2023-05-10 NOTE — Op Note (Addendum)
Robotic assisted Laparoscopic Transabdominal Bilateral Inguinal Hernia Repair with 3 D large Mesh Umbilical hernia repair 1.2cm     Pre-operative Diagnosis:  bilateral Inguinal Hernias, umbilical hernia   Post-operative Diagnosis: Same   Procedure: Robotic  Laparoscopic  repair of Bilateral inguinal hernias, umbilical hernia repair   Surgeon: Sterling Big, MD FACS   Anesthesia: Gen. with endotracheal tube   Findings: Direct bilateral inguinal hernias, umbilical hernia repair chronically incarcerated         Procedure Details  The patient was seen again in the Holding Room. The benefits, complications, treatment options, and expected outcomes were discussed with the patient. The risks of bleeding, infection, recurrence of symptoms, failure to resolve symptoms, recurrence of hernia, ischemic orchitis, chronic pain syndrome or neuroma, were discussed again. The likelihood of improving the patient's symptoms with return to their baseline status is good.  The patient and/or family concurred with the proposed plan, giving informed consent.  The patient was taken to Operating Room, identified  and the procedure verified as Laparoscopic Inguinal Hernia Repair. Laterality confirmed.  A Time Out was held and the above information confirmed.   Prior to the induction of general anesthesia, antibiotic prophylaxis was administered. VTE prophylaxis was in place. General endotracheal anesthesia was then administered and tolerated well. After the induction, the abdomen was prepped with Chloraprep and draped in the sterile fashion. The patient was positioned in the supine position.   Supraumbilical incision created and umbilical hernia was encountered, the sac was excised. Cut down technique used to enter the abdominal cavity. Fascia elevated and incised and hasson trochar placed. Pneumoperitoneum obtained w/o HD changes. No evidence of bowel injuries.  Two 8 mm placed under direct vision. The laparoscopy  revealed large direct defects bilaterally. I inserted the needles and the mesh. The robot was brought ot the table and docked in the standard fashion, no collision between arms was observed. Instruments were kept under direct view at all times. We started on the right side were a flap was created. The sac was reduced and dissected free from adjacent structures. We preserved the vas and the vessels. Once dissection was completed a large 3D mesh was placed and secured with two interrupted vicryl attached to the pubic tubercle. There was good coverage of the direct, indirect and femoral spaces. The flap was closed with v lock suture.   Attention then was turned to the left side were a flap was created. The sac was reduced and dissected free from adjacent structures. We preserved the vas and the vessels. Once dissection was completed a large 3D mesh was placed and secured with two interrupted vicryl attached to the pubic tubercle. There was good coverage of the direct, indirect and femoral spaces. The flap was closed with v lock suture. Second look revealed no complications or injuries.   Once assuring that hemostasis was adequate the ports were removed .  Umbilical hernia was repaired with multiple 0 ethibonds, defect measured 1.2cm   4-0 subcuticular Monocryl was used at all skin edges. Dermabond was placed.  Patient tolerated the procedure well. There were no complications. He was taken to the recovery room in stable condition.

## 2023-05-10 NOTE — Transfer of Care (Signed)
Immediate Anesthesia Transfer of Care Note  Patient: [facility]  Procedure(s) Performed: XI ROBOTIC ASSISTED BILATERAL INGUINAL HERNIA (Bilateral) HERNIA REPAIR UMBILICAL ADULT INSERTION OF MESH (Bilateral: Inguinal)  Patient Location: PACU  Anesthesia Type:General  Level of Consciousness: drowsy  Airway & Oxygen Therapy: Patient Spontanous Breathing and Patient connected to face mask oxygen  Post-op Assessment: Report given to RN and Post -op Vital signs reviewed and stable  Post vital signs: Reviewed and stable  Last Vitals:  Vitals Value Taken Time  BP 102/56 05/10/23 1222  Temp    Pulse 82 05/10/23 1225  Resp 13 05/10/23 1225  SpO2 99 % 05/10/23 1225  Vitals shown include unfiled device data.  Last Pain:  Vitals:   05/10/23 0814  PainSc: 0-No pain      Patients Stated Pain Goal: 0 (05/10/23 0814)  Complications: No notable events documented.

## 2023-05-10 NOTE — Interval H&P Note (Signed)
History and Physical Interval Note:  05/10/2023 9:37 AM  [facility]  has presented today for surgery, with the diagnosis of bilateral inguinal hernia  umbilical hernia 3 cm.  The various methods of treatment have been discussed with the patient and family. After consideration of risks, benefits and other options for treatment, the patient has consented to  Procedure(s): XI ROBOTIC ASSISTED BILATERAL INGUINAL HERNIA (Bilateral) HERNIA REPAIR UMBILICAL ADULT (N/A) as a surgical intervention.  The patient's history has been reviewed, patient examined, no change in status, stable for surgery.  I have reviewed the patient's chart and labs.  Questions were answered to the patient's satisfaction.     Kelley Polinsky F [place]

## 2023-05-11 ENCOUNTER — Encounter: Payer: Self-pay | Admitting: Surgery

## 2023-05-11 LAB — SURGICAL PATHOLOGY

## 2023-06-02 ENCOUNTER — Ambulatory Visit
Admission: EM | Admit: 2023-06-02 | Discharge: 2023-06-02 | Disposition: A | Discharge status: Home / Self Care | Payer: 59 | Attending: Emergency Medicine | Admitting: Emergency Medicine

## 2023-06-02 DIAGNOSIS — J0101 Acute recurrent maxillary sinusitis: Secondary | ICD-10-CM | POA: Diagnosis not present

## 2023-06-02 DIAGNOSIS — R030 Elevated blood-pressure reading, without diagnosis of hypertension: Secondary | ICD-10-CM | POA: Insufficient documentation

## 2023-06-02 MED ORDER — AMOXICILLIN-POT CLAVULANATE 875-125 MG PO TABS: 1.0000 | ORAL_TABLET | Freq: Two times a day (BID) | ORAL | 0 refills | Status: AC

## 2023-06-02 NOTE — ED Triage Notes (Signed)
 Sx x 1 week   Nasal congestion-using afrin Facial pain Headache

## 2023-06-02 NOTE — ED Provider Notes (Signed)
 [facility]    CSN: [id number] Arrival date & time: 06/02/23  1652      History   Chief Complaint Chief Complaint  Patient presents with   Nasal Congestion   Headache   Facial Pain    HPI Seth Stone is a 53 y.o. male.   54 year old male patient, Seth Stone, presents to urgent care for evaluation of nasal congestion, facial pain, and headache for 1 week.  The history is provided by the patient. No language interpreter was used.    Past Medical History:  Diagnosis Date   Aortic atherosclerosis (HCC)    B12 deficiency    Benign neoplasm of cecum    Cervical radiculopathy    ED (erectile dysfunction)    GERD (gastroesophageal reflux disease)    Inguinal hernia, bilateral    Kidney stone    Migraine headache    1-2x/month   Nasal turbinate hypertrophy    Obesity (BMI 30-39.9)    Rotator cuff tendonitis, right    Umbilical hernia    Urolithiasis    Vitamin D  deficiency     Patient Active Problem List   Diagnosis Date Noted   Acute recurrent maxillary sinusitis 06/02/2023   Non-recurrent bilateral inguinal hernia without obstruction or gangrene 05/10/2023   Unil inguinal hernia, w/o obst or gangr, not spcf as recur 06/10/2022   Incarcerated umbilical hernia 06/10/2022   Encounter for screening colonoscopy    Benign neoplasm of cecum    Encounter for preventive health examination 01/26/2022   Change in vision 01/26/2022   Allergic rhinitis 08/24/2020   History of nephrolithiasis 07/24/2020   Tendonitis, Achilles, left 07/24/2020   Cervical disc disorder at C6-C7 level with radiculopathy 04/12/2018   Rotator cuff tendinitis, right 03/29/2018   Aortic atherosclerosis (HCC) 02/19/2018   Elevated blood pressure reading 07/11/2017   Nasal turbinate hypertrophy 04/26/2017   B12 deficiency 11/19/2016   Vitamin D  deficiency 11/19/2016   Obesity (BMI 30.0-34.9) 11/19/2016   Gastroesophageal reflux disease with esophagitis 11/19/2016    Past  Surgical History:  Procedure Laterality Date   CERVICAL DISC ARTHROPLASTY     C6/7   COLONOSCOPY WITH PROPOFOL  N/A 03/19/2022   Procedure: COLONOSCOPY WITH BIOPSY;  Surgeon: Jinny Carmine, MD;  Location: [facility];  Service: Endoscopy;  Laterality: N/A;   EXTRACORPOREAL SHOCK WAVE LITHOTRIPSY Left 09/27/2019   Procedure: EXTRACORPOREAL SHOCK WAVE LITHOTRIPSY (ESWL);  Surgeon: Francisca Redell BROCKS, MD;  Location: [facility];  Service: Urology;  Laterality: Left;   hemorrhiordectomy  2012   INSERTION OF MESH Bilateral 05/10/2023   Procedure: INSERTION OF MESH;  Surgeon: Jordis Laneta FALCON, MD;  Location: [facility];  Service: General;  Laterality: Bilateral;   LITHOTRIPSY  2017   NASAL SEPTUM SURGERY  2014   NASAL SINUS SURGERY  2016   POLYPECTOMY N/A 03/19/2022   Procedure: POLYPECTOMY;  Surgeon: Jinny Carmine, MD;  Location: [facility];  Service: Endoscopy;  Laterality: N/A;   UMBILICAL HERNIA REPAIR N/A 05/10/2023   Procedure: HERNIA REPAIR UMBILICAL ADULT;  Surgeon: Jordis Laneta FALCON, MD;  Location: [facility];  Service: General;  Laterality: N/A;   URETEROSCOPY         Home Medications    Prior to Admission medications   Medication Sig Start Date End Date Taking? Authorizing Provider  amoxicillin -clavulanate (AUGMENTIN ) 875-125 MG tablet Take 1 tablet by mouth every 12 (twelve) hours for 7 days. 06/02/23 06/09/23 Yes Reuel Lamadrid, Rilla, NP  CALCIUM  PO Take 4 tablets by mouth as needed (  Pt states he forgets to take this on a daily basis).    [provider]  HYDROcodone -acetaminophen  (NORCO/VICODIN) 5-325 MG tablet Take 1-2 tablets by mouth every 4 (four) hours as needed for moderate pain (pain score 4-6). 05/10/23   Pabon, Diego F, MD  omeprazole (PRILOSEC OTC) 20 MG tablet Take 20 mg by mouth daily as needed (acid reflux).    [provider]  tadalafil  (CIALIS ) 5 MG tablet Take 1 tablet (5 mg total) by mouth daily as needed for erectile dysfunction. Patient taking  differently: Take 5 mg by mouth 3 (three) times a week. 12/24/22   Helon Clotilda LABOR, PA-C    Family History Family History  Problem Relation Age of Onset   Diabetes Mother    Alcohol abuse Father    Arthritis Father    Hypertension Father    Emphysema Sister    Cancer Brother 42       prostate Cancer   Colon cancer Maternal Grandmother    Stroke Maternal Grandmother    Hyperlipidemia Maternal Grandfather     Social History Social History   Tobacco Use   Smoking status: Never    Passive exposure: Never   Smokeless tobacco: Never  Vaping Use   Vaping status: Never Used  Substance Use Topics   Alcohol use: Yes    Comment: rare   Drug use: No     Allergies   Patient has no known allergies.   Review of Systems Review of Systems  Constitutional:  Negative for fever.  HENT:  Positive for congestion, sinus pressure and sinus pain.   Neurological:  Positive for headaches.  All other systems reviewed and are negative.    Physical Exam Triage Vital Signs ED Triage Vitals  Encounter Vitals Group     BP 06/02/23 1709 (!) 152/99     Systolic BP Percentile --      Diastolic BP Percentile --      Pulse Rate 06/02/23 1709 (!) 56     Resp 06/02/23 1709 18     Temp 06/02/23 1709 98 F (36.7 C)     Temp Source 06/02/23 1709 Oral     SpO2 06/02/23 1709 97 %     Weight --      Height --      Head Circumference --      Peak Flow --      Pain Score 06/02/23 1708 7     Pain Loc --      Pain Education --      Exclude from Growth Chart --    No data found.  Updated Vital Signs BP (!) 152/99 (BP Location: Right Arm)   Pulse (!) 56   Temp 98 F (36.7 C) (Oral)   Resp 18   SpO2 97%   Visual Acuity Right Eye Distance:   Left Eye Distance:   Bilateral Distance:    Right Eye Near:   Left Eye Near:    Bilateral Near:     Physical Exam Vitals and nursing note reviewed.      UC Treatments / Results  Labs (all labs ordered are listed, but only abnormal  results are displayed) Labs Reviewed - No data to display  EKG   Radiology No results found.  Procedures Procedures (including critical care time)  Medications Ordered in UC Medications - No data to display  Initial Impression / Assessment and Plan / UC Course  I have reviewed the triage vital signs and the nursing  notes.  Pertinent labs & imaging results that were available during my care of the patient were reviewed by me and considered in my medical decision making (see chart for details).    Discussed exam findings plan of care with patient patient verbalized understanding this provider, strict go to ER precautions given  Ddx:  Acute maxillary sinusitis, elevated blood pressure reading Final Clinical Impressions(s) / UC Diagnoses   Final diagnoses:  Acute recurrent maxillary sinusitis  Elevated blood pressure reading     Discharge Instructions      Take antibiotic as directed.  Rest, push fluids, may use over-the-counter Mucinex as label directed.  Follow-up with PCP.  If you have new or worsening symptoms go to emergency room for further evaluation     ED Prescriptions     Medication Sig Dispense Auth. Provider   amoxicillin -clavulanate (AUGMENTIN ) 875-125 MG tablet Take 1 tablet by mouth every 12 (twelve) hours for 7 days. 14 tablet Omir Cooprider, Rilla, NP      PDMP not reviewed this encounter.   Aminta Rilla, NP 06/02/23 [phone]

## 2023-06-02 NOTE — Discharge Instructions (Addendum)
 Take antibiotic as directed.  Rest, push fluids, may use over-the-counter Mucinex as label directed.  Follow-up with PCP.  If you have new or worsening symptoms go to emergency room for further evaluation

## 2023-06-06 ENCOUNTER — Encounter: Payer: Self-pay | Admitting: Surgery

## 2023-06-06 ENCOUNTER — Ambulatory Visit (INDEPENDENT_AMBULATORY_CARE_PROVIDER_SITE_OTHER): Payer: 59 | Admitting: Surgery

## 2023-06-06 VITALS — BP 128/82 | HR 90 | Temp 98.0°F | Ht 73.0 in | Wt 237.2 lb

## 2023-06-06 DIAGNOSIS — K4020 Bilateral inguinal hernia, without obstruction or gangrene, not specified as recurrent: Secondary | ICD-10-CM

## 2023-06-06 DIAGNOSIS — Z09 Encounter for follow-up examination after completed treatment for conditions other than malignant neoplasm: Secondary | ICD-10-CM

## 2023-06-06 NOTE — Patient Instructions (Signed)

## 2023-06-07 NOTE — Progress Notes (Signed)
 Mr. Kasler is a 53 year old male s/p robotic transabdominal bilateral inguinal hernia repair and umbilical hernia repair. He has done very well.  Endorses no abdominal pain no fevers no chills. Very occasional intermittent abdominal discomfort that is improving. He is ambulating, and starting to exercise  PE NAD Abd: soft, NT, incisions healing well without evidence of infection.  There is no evidence of ventral or inguinal recurrences.  A/P Mr. Purtee is 4 weeks out following hernia repairs.  He is doing very well without evidence of recurrent or any complications.  At this time he may return to work and all activities deemed necessary.  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 I will see him on a as needed basis

## 2023-06-17 ENCOUNTER — Ambulatory Visit: Payer: 59 | Admitting: Internal Medicine

## 2023-06-20 ENCOUNTER — Other Ambulatory Visit: Payer: 59

## 2023-06-22 ENCOUNTER — Other Ambulatory Visit: Payer: 59

## 2023-06-27 ENCOUNTER — Other Ambulatory Visit: Payer: 59

## 2023-06-27 DIAGNOSIS — Z302 Encounter for sterilization: Secondary | ICD-10-CM

## 2023-06-28 LAB — POST-VAS SPERM EVALUATION,QUAL: Volume: 1.8 mL

## 2023-07-11 ENCOUNTER — Ambulatory Visit: Payer: 59 | Admitting: Internal Medicine

## 2023-10-07 ENCOUNTER — Telehealth: Payer: Self-pay | Admitting: Urology

## 2023-10-07 NOTE — Telephone Encounter (Signed)
 Pt called to schedule there second semen analysis and pt stated they only have on lab and never scheduled the second and would like to see if they can set up there second semen analysis.

## 2023-10-10 NOTE — Telephone Encounter (Signed)
 See my chart message

## 2023-10-19 ENCOUNTER — Ambulatory Visit: Admitting: Internal Medicine

## 2023-10-19 VITALS — BP 120/78 | HR 70 | Ht 73.0 in | Wt 255.0 lb

## 2023-10-19 DIAGNOSIS — I700 Atherosclerosis of aorta: Secondary | ICD-10-CM

## 2023-10-19 DIAGNOSIS — Z8042 Family history of malignant neoplasm of prostate: Secondary | ICD-10-CM | POA: Diagnosis not present

## 2023-10-19 DIAGNOSIS — R350 Frequency of micturition: Secondary | ICD-10-CM | POA: Diagnosis not present

## 2023-10-19 DIAGNOSIS — E785 Hyperlipidemia, unspecified: Secondary | ICD-10-CM | POA: Diagnosis not present

## 2023-10-19 DIAGNOSIS — R5383 Other fatigue: Secondary | ICD-10-CM | POA: Diagnosis not present

## 2023-10-19 DIAGNOSIS — N401 Enlarged prostate with lower urinary tract symptoms: Secondary | ICD-10-CM | POA: Diagnosis not present

## 2023-10-19 DIAGNOSIS — Z87442 Personal history of urinary calculi: Secondary | ICD-10-CM

## 2023-10-19 DIAGNOSIS — K2100 Gastro-esophageal reflux disease with esophagitis, without bleeding: Secondary | ICD-10-CM

## 2023-10-19 DIAGNOSIS — E538 Deficiency of other specified B group vitamins: Secondary | ICD-10-CM

## 2023-10-19 DIAGNOSIS — Z0000 Encounter for general adult medical examination without abnormal findings: Secondary | ICD-10-CM

## 2023-10-19 DIAGNOSIS — Z8719 Personal history of other diseases of the digestive system: Secondary | ICD-10-CM

## 2023-10-19 DIAGNOSIS — E66811 Obesity, class 1: Secondary | ICD-10-CM

## 2023-10-19 DIAGNOSIS — R7301 Impaired fasting glucose: Secondary | ICD-10-CM

## 2023-10-19 MED ORDER — TAMSULOSIN HCL 0.4 MG PO CAPS: 0.4000 mg | ORAL_CAPSULE | Freq: Every day | ORAL | 3 refills | Status: AC

## 2023-10-19 NOTE — Progress Notes (Unsigned)
 Subjective:  Patient ID: Seth Stone, male    DOB: 1971-04-30  Age: 53 y.o. MRN: [id number]  CC: The primary encounter diagnosis was Hyperlipidemia LDL goal <100. Diagnoses of Impaired fasting glucose, Other fatigue, B12 deficiency, Benign prostatic hyperplasia with urinary frequency, Family history of prostate cancer, History of nephrolithiasis, Aortic atherosclerosis (HCC), Encounter for preventive health examination, History of umbilical hernia repair, History of bilateral inguinal hernia repair, Obesity (BMI 30.0-34.9), and Gastroesophageal reflux disease with esophagitis without hemorrhage were also pertinent to this visit.   HPI Seth Stone presents for  Chief Complaint  Patient presents with   Medical Management of Chronic Issues    Since his last visit Seth Stone is S/p bilateral inguinalhernia repair,  robotic  by [employer] in January 2025 , as well as s/p  Vasectomy in October 2024 by Dr Charm Coombs,, and reports that he remains l tender along the vasectomy  surgical incision. He has stopped wearing  underwear with support of scrotum .  Has resumed exercise  as of yesterday  with Seth Stone,  feels bvery sore   2) obesity: concerned because he has gained weight and not eating more , but has not exercised in a month .  R  3) nephrolithiasis : he has a retained 1 mm stone in the right kidney by lat CT but has not had any symptoms of flank pain of hematuria .  He has been required to prove that he has no increased burden of stones by the [employer] for his annual renewal.    4) Elevated blood pressure readings at home have been ranging from 130/90  once   150/90 on home machine using biceps cuff   5) BPH:  placed on 5  mg cialis  by Urology.  He notes improved urine stream  when he takes it,  but it gives him a headache and raises his BP. No prior trial of flomax     6) GERD:  able to skip PPI for up to 2 weeks,  GERD symptoms are  triggered by overindulgence  7) b12 defciency :  he is no  longer  SUPPLEMENTING   Outpatient Medications Prior to Visit  Medication Sig Dispense Refill   CALCIUM PO Take 4 tablets by mouth as needed (Pt states he forgets to take this on a daily basis).     omeprazole (PRILOSEC OTC) 20 MG tablet Take 20 mg by mouth daily as needed (acid reflux).     tadalafil  (CIALIS ) 5 MG tablet Take 1 tablet (5 mg total) by mouth daily as needed for erectile dysfunction. (Patient not taking: Reported on 10/19/2023) 90 tablet 3   No facility-administered medications prior to visit.    Review of Systems;  Patient denies headache, fevers, malaise, unintentional weight loss, skin rash, eye pain, sinus congestion and sinus pain, sore throat, dysphagia,  hemoptysis , cough, dyspnea, wheezing, chest pain, palpitations, orthopnea, edema, abdominal pain, nausea, melena, diarrhea, constipation, flank pain, dysuria, hematuria, urinary  Frequency, nocturia, numbness, tingling, seizures,  Focal weakness, Loss of consciousness,  Tremor, insomnia, depression, anxiety, and suicidal ideation.      Objective:  BP 120/78   Pulse 70   Ht 6\' 1"  (1.854 m)   Wt 255 lb (115.7 kg)   SpO2 97%   BMI 33.64 kg/m   BP Readings from Last 3 Encounters:  10/19/23 120/78  06/06/23 128/82  06/02/23 (!) 152/99    Wt Readings from Last 3 Encounters:  10/19/23 255 lb (115.7 kg)  06/06/23 237  lb 3.2 oz (107.6 kg)  05/10/23 238 lb (108 kg)    Physical Exam Vitals reviewed.  Constitutional:      General: He is not in acute distress.    Appearance: Normal appearance. He is obese. He is not ill-appearing, toxic-appearing or diaphoretic.  HENT:     Head: Normocephalic.  Eyes:     General: No scleral icterus.       Right eye: No discharge.        Left eye: No discharge.     Conjunctiva/sclera: Conjunctivae normal.  Cardiovascular:     Rate and Rhythm: Normal rate and regular rhythm.     Heart sounds: Normal heart sounds.  Pulmonary:     Effort: Pulmonary effort is normal. No  respiratory distress.     Breath sounds: Normal breath sounds.  Musculoskeletal:        General: Normal range of motion.     Cervical back: Normal range of motion.  Skin:    General: Skin is warm and dry.  Neurological:     General: No focal deficit present.     Mental Status: He is alert and oriented to person, place, and time. Mental status is at baseline.  Psychiatric:        Mood and Affect: Mood normal.        Behavior: Behavior normal.        Thought Content: Thought content normal.        Judgment: Judgment normal.     Lab Results  Component Value Date   HGBA1C 5.4 10/19/2023   HGBA1C 5.5 01/26/2022    Lab Results  Component Value Date   CREATININE 1.02 10/19/2023   CREATININE 0.82 05/06/2023   CREATININE 0.92 06/10/2022    Lab Results  Component Value Date   WBC 5.9 10/19/2023   HGB 15.9 10/19/2023   HCT 46.9 10/19/2023   PLT 212.0 10/19/2023   GLUCOSE 84 10/19/2023   CHOL 202 (H) 10/19/2023   TRIG 181.0 (H) 10/19/2023   HDL 48.40 10/19/2023   LDLDIRECT 137.0 10/19/2023   LDLCALC 117 (H) 10/19/2023   ALT 35 10/19/2023   AST 34 10/19/2023   NA 140 10/19/2023   K 3.8 10/19/2023   CL 103 10/19/2023   CREATININE 1.02 10/19/2023   BUN 15 10/19/2023   CO2 30 10/19/2023   TSH 1.60 10/19/2023   PSA 1.24 10/19/2023   HGBA1C 5.4 10/19/2023   MICROALBUR 1.4 10/19/2023    No results found.  Assessment & Plan:  .Hyperlipidemia LDL goal <100 -     Lipid panel -     LDL cholesterol, direct  Impaired fasting glucose -     Comprehensive metabolic panel with GFR -     Hemoglobin A1c  Other fatigue -     CBC with Differential/Platelet -     TSH  B12 deficiency Assessment & Plan: RECURRENT  attributed initially to daily use of PPI,  however he has limited use of PPI and repeat level is low.  IM supplementation and IF antibody needed  Lab Results  Component Value Date   VITAMINB12 183 (L) 10/19/2023     Orders: -     B12 and Folate Panel -      Intrinsic Factor Antibodies; Future  Benign prostatic hyperplasia with urinary frequency -     Microalbumin / creatinine urine ratio -     Urinalysis, Routine w reflex microscopic  Family history of prostate cancer -     PSA  History of nephrolithiasis Assessment & Plan: With a 1 mm retained stone in right kidney by Jan 2025 CT .  He is asymptomatic and has calcium oxalate crystals in urine.  Will recommend adding citric acid to his water   and repeat CT in October   Orders: -     CT RENAL STONE STUDY; Future  Aortic atherosclerosis (HCC) Assessment & Plan: Has been noncompliant with simvastatin  due to forgetting to take it. CAC score was ZERO IN 2023 AND 10 yr risk is 4% by current panel. NO TREATMENT INDICATED   Last lipids Lab Results  Component Value Date   CHOL 202 (H) 10/19/2023   HDL 48.40 10/19/2023   LDLCALC 117 (H) 10/19/2023   LDLDIRECT 137.0 10/19/2023   TRIG 181.0 (H) 10/19/2023   CHOLHDL 4 10/19/2023      Encounter for preventive health examination  History of umbilical hernia repair Assessment & Plan: Repaired during inguianl hernia repair Dec 2024     History of bilateral inguinal hernia repair  Obesity (BMI 30.0-34.9) Assessment & Plan: I have addressed  BMI and recommended wt loss of 10% of body weigh over the next 6 months using a low glycemic index diet and regular exercise a minimum of 5 days per week. Screened for hypothyroid and diabetes  Lab Results  Component Value Date   TSH 1.60 10/19/2023   . Lab Results  Component Value Date   HGBA1C 5.4 10/19/2023       Gastroesophageal reflux disease with esophagitis without hemorrhage Assessment & Plan: Has  reduced use o PPI to < 2 weeks per month  with careful dietary restrictios    Other orders -     Tamsulosin  HCl; Take 1 capsule (0.4 mg total) by mouth daily.  Dispense: 30 capsule; Refill: 3     I spent 44 minutes on the day of this face to face encounter reviewing patient's   most recent visit with Urology, general surery  prior relevant surgical and non surgical procedures, recent  labs and imaging studies, counseling on weight management,  reviewing the assessment and plan with patient, and post visit ordering and reviewing of  diagnostics and therapeutics with patient  .   Follow-up: Return for physical.   Thersia Flax, MD

## 2023-10-19 NOTE — Patient Instructions (Addendum)
 I will order the repeat CT scan of your kidneys to image the retained kidney stone ;    you can set it up at the appropriate time   Trial of Flomax  (tamsulosin ) to manage the BPH.  Take it Daily     Schedule a RN visit to check your home BP machine if the readings are still>130/80  We are repeating your PSA today along with urine studies

## 2023-10-20 ENCOUNTER — Ambulatory Visit: Payer: Self-pay | Admitting: Internal Medicine

## 2023-10-20 DIAGNOSIS — E538 Deficiency of other specified B group vitamins: Secondary | ICD-10-CM

## 2023-10-20 LAB — URINALYSIS, ROUTINE W REFLEX MICROSCOPIC
Bilirubin Urine: NEGATIVE
Hgb urine dipstick: NEGATIVE
Ketones, ur: NEGATIVE
Leukocytes,Ua: NEGATIVE
Nitrite: NEGATIVE
RBC / HPF: NONE SEEN (ref 0–?)
Specific Gravity, Urine: 1.025 (ref 1.000–1.030)
Total Protein, Urine: NEGATIVE
Urine Glucose: NEGATIVE
Urobilinogen, UA: 0.2 (ref 0.0–1.0)
pH: 6 (ref 5.0–8.0)

## 2023-10-20 LAB — LIPID PANEL
Cholesterol: 202 mg/dL — ABNORMAL HIGH (ref 0–200)
HDL: 48.4 mg/dL (ref >39.00)
LDL Cholesterol: 117 mg/dL — ABNORMAL HIGH (ref 0–99)
NonHDL: 153.35
Total CHOL/HDL Ratio: 4
Triglycerides: 181 mg/dL — ABNORMAL HIGH (ref 0.0–149.0)
VLDL: 36.2 mg/dL (ref 0.0–40.0)

## 2023-10-20 LAB — CBC WITH DIFFERENTIAL/PLATELET
Basophils Absolute: 0.1 10*3/uL (ref 0.0–0.1)
Basophils Relative: 1.1 % (ref 0.0–3.0)
Eosinophils Absolute: 0.1 10*3/uL (ref 0.0–0.7)
Eosinophils Relative: 2 % (ref 0.0–5.0)
HCT: 46.9 % (ref 39.0–52.0)
Hemoglobin: 15.9 g/dL (ref 13.0–17.0)
Lymphocytes Relative: 31.6 % (ref 12.0–46.0)
Lymphs Abs: 1.9 10*3/uL (ref 0.7–4.0)
MCHC: 34 g/dL (ref 30.0–36.0)
MCV: 89.8 fl (ref 78.0–100.0)
Monocytes Absolute: 0.5 10*3/uL (ref 0.1–1.0)
Monocytes Relative: 8.8 % (ref 3.0–12.0)
Neutro Abs: 3.4 10*3/uL (ref 1.4–7.7)
Neutrophils Relative %: 56.5 % (ref 43.0–77.0)
Platelets: 212 10*3/uL (ref 150.0–400.0)
RBC: 5.22 Mil/uL (ref 4.22–5.81)
RDW: 13.3 % (ref 11.5–15.5)
WBC: 5.9 10*3/uL (ref 4.0–10.5)

## 2023-10-20 LAB — COMPREHENSIVE METABOLIC PANEL WITH GFR
ALT: 35 U/L (ref 0–53)
AST: 34 U/L (ref 0–37)
Albumin: 4.4 g/dL (ref 3.5–5.2)
Alkaline Phosphatase: 80 U/L (ref 39–117)
BUN: 15 mg/dL (ref 6–23)
CO2: 30 meq/L (ref 19–32)
Calcium: 9.5 mg/dL (ref 8.4–10.5)
Chloride: 103 meq/L (ref 96–112)
Creatinine, Ser: 1.02 mg/dL (ref 0.40–1.50)
GFR: 84.38 mL/min (ref >60.00)
Glucose, Bld: 84 mg/dL (ref 70–99)
Potassium: 3.8 meq/L (ref 3.5–5.1)
Sodium: 140 meq/L (ref 135–145)
Total Bilirubin: 1.3 mg/dL — ABNORMAL HIGH (ref 0.2–1.2)
Total Protein: 7.4 g/dL (ref 6.0–8.3)

## 2023-10-20 LAB — B12 AND FOLATE PANEL
Folate: 11.2 ng/mL (ref >5.9)
Vitamin B-12: 183 pg/mL — ABNORMAL LOW (ref 211–911)

## 2023-10-20 LAB — MICROALBUMIN / CREATININE URINE RATIO
Creatinine,U: 205.2 mg/dL
Microalb Creat Ratio: 7 mg/g (ref 0.0–30.0)
Microalb, Ur: 1.4 mg/dL (ref 0.0–1.9)

## 2023-10-20 LAB — HEMOGLOBIN A1C: Hgb A1c MFr Bld: 5.4 % (ref 4.6–6.5)

## 2023-10-20 LAB — PSA: PSA: 1.24 ng/mL (ref 0.10–4.00)

## 2023-10-20 LAB — LDL CHOLESTEROL, DIRECT: Direct LDL: 137 mg/dL

## 2023-10-20 LAB — TSH: TSH: 1.6 u[IU]/mL (ref 0.35–5.50)

## 2023-10-20 NOTE — Assessment & Plan Note (Addendum)
 Repaired during inguianl hernia repair Dec 2024

## 2023-10-20 NOTE — Assessment & Plan Note (Signed)
 RECURRENT  attributed initially to daily use of PPI,  however he has limited use of PPI and repeat level is low.  IM supplementation and IF antibody needed  Lab Results  Component Value Date   VITAMINB12 183 (L) 10/19/2023

## 2023-10-20 NOTE — Assessment & Plan Note (Addendum)
 Has been noncompliant with simvastatin  due to forgetting to take it. CAC score was ZERO IN 2023 AND 10 yr risk is 4% by current panel. NO TREATMENT INDICATED   Last lipids Lab Results  Component Value Date   CHOL 202 (H) 10/19/2023   HDL 48.40 10/19/2023   LDLCALC 117 (H) 10/19/2023   LDLDIRECT 137.0 10/19/2023   TRIG 181.0 (H) 10/19/2023   CHOLHDL 4 10/19/2023

## 2023-10-20 NOTE — Assessment & Plan Note (Signed)
 Has  reduced use o PPI to < 2 weeks per month  with careful dietary restrictios

## 2023-10-20 NOTE — Assessment & Plan Note (Signed)
 With a 1 mm retained stone in right kidney by Jan 2025 CT .  He is asymptomatic and has calcium oxalate crystals in urine.  Will recommend adding citric acid to his water   and repeat CT in October

## 2023-10-20 NOTE — Assessment & Plan Note (Signed)
 RECURRENT. Resume weekly injections and check an  IF ab

## 2023-10-20 NOTE — Assessment & Plan Note (Deleted)

## 2023-10-20 NOTE — Assessment & Plan Note (Signed)
 I have addressed  BMI and recommended wt loss of 10% of body weigh over the next 6 months using a low glycemic index diet and regular exercise a minimum of 5 days per week. Screened for hypothyroid and diabetes  Lab Results  Component Value Date   TSH 1.60 10/19/2023   . Lab Results  Component Value Date   HGBA1C 5.4 10/19/2023

## 2023-10-25 ENCOUNTER — Telehealth: Payer: Self-pay

## 2023-10-25 NOTE — Telephone Encounter (Signed)
 Copied from CRM [id number]. Topic: General - Other >> Oct 25, 2023  1:48 PM Abigail D wrote: Reason for CRM: Patient is calling to schedule his b12 injections, no order placed yet.

## 2023-10-26 NOTE — Telephone Encounter (Signed)
 Spoke with pt and scheduled him for is first b12 injection. Order is documented in the result note.

## 2023-11-01 ENCOUNTER — Ambulatory Visit (INDEPENDENT_AMBULATORY_CARE_PROVIDER_SITE_OTHER)

## 2023-11-01 DIAGNOSIS — E538 Deficiency of other specified B group vitamins: Secondary | ICD-10-CM | POA: Diagnosis not present

## 2023-11-01 MED ORDER — CYANOCOBALAMIN 1000 MCG/ML IJ SOLN
1000.0000 ug | Freq: Once | INTRAMUSCULAR | Status: AC
  Administered 2023-11-01: 1000 ug via INTRAMUSCULAR

## 2023-11-01 NOTE — Progress Notes (Signed)
 Patient presented for 1st  B 12 injection to left deltoid, patient voiced no concerns nor showed any signs of distress during injection

## 2023-11-08 ENCOUNTER — Ambulatory Visit (INDEPENDENT_AMBULATORY_CARE_PROVIDER_SITE_OTHER)

## 2023-11-08 DIAGNOSIS — E538 Deficiency of other specified B group vitamins: Secondary | ICD-10-CM

## 2023-11-08 MED ORDER — CYANOCOBALAMIN 1000 MCG/ML IJ SOLN
1000.0000 ug | Freq: Once | INTRAMUSCULAR | Status: AC
  Administered 2023-11-08: 1000 ug via INTRAMUSCULAR

## 2023-11-08 NOTE — Progress Notes (Signed)
 Patient presented for B 12 injection to left deltoid, patient voiced no concerns nor showed any signs of distress during injection   Pt also stated that he would like to do his B12 injection @ home. Pt stated that his wife can give it to him.would like to do next one at home she knows how to give injection

## 2023-11-09 ENCOUNTER — Other Ambulatory Visit: Payer: Self-pay | Admitting: Internal Medicine

## 2023-11-09 MED ORDER — CYANOCOBALAMIN 1000 MCG/ML IJ SOLN: 1000.0000 ug | INTRAMUSCULAR | 0 refills | Status: DC

## 2023-11-09 MED ORDER — SYRINGE 25G X 1" 3 ML MISC: 0 refills | Status: AC

## 2023-11-15 ENCOUNTER — Ambulatory Visit

## 2023-11-22 ENCOUNTER — Ambulatory Visit

## 2023-12-03 ENCOUNTER — Other Ambulatory Visit: Payer: Self-pay | Admitting: Internal Medicine

## 2023-12-06 NOTE — Telephone Encounter (Signed)
 Pt has completed the weekly injections is it okay to change rx directions to once monthly?

## 2023-12-20 ENCOUNTER — Ambulatory Visit
Admission: EM | Admit: 2023-12-20 | Discharge: 2023-12-20 | Disposition: A | Discharge status: Home / Self Care | Attending: Emergency Medicine | Admitting: Emergency Medicine

## 2023-12-20 DIAGNOSIS — J0100 Acute maxillary sinusitis, unspecified: Secondary | ICD-10-CM

## 2023-12-20 MED ORDER — PREDNISONE 20 MG PO TABS: 40.0000 mg | ORAL_TABLET | Freq: Every day | ORAL | 0 refills | Status: AC

## 2023-12-20 MED ORDER — IBUPROFEN 600 MG PO TABS: 600.0000 mg | ORAL_TABLET | Freq: Three times a day (TID) | ORAL | 0 refills | Status: DC | PRN

## 2023-12-20 MED ORDER — FLUTICASONE PROPIONATE 50 MCG/ACT NA SUSP: 2.0000 | Freq: Every day | NASAL | 0 refills | Status: AC

## 2023-12-20 MED ORDER — AMOXICILLIN-POT CLAVULANATE 875-125 MG PO TABS: 1.0000 | ORAL_TABLET | Freq: Two times a day (BID) | ORAL | 0 refills | Status: DC

## 2023-12-20 NOTE — ED Triage Notes (Signed)
 Patient states that  sx have been going on for a few days with post nasal drip. Patient states that he woke up yesterday morning with right side facial and ear pain. He's been taking something for headache and a nasal spray

## 2023-12-20 NOTE — ED Provider Notes (Signed)
 HPI  SUBJECTIVE:  Seth Stone is a 53 y.o. male who presents with 3 to 4 days of thick postnasal drip, right sided sinus pain and pressure, upper dental pain, right-sided headache behind his eye, right ear pain and tinnitus, cough productive of thick phlegm in the morning that clears during the day.  No fevers, rhinorrhea, facial swelling, change in hearing, otorrhea.  He is able to sleep at night without waking up coughing.  No wheezing, chest pain, shortness of breath.  No vertigo, sore throat.  He tried Excedrin with improvement.  Sinus pain and pressure is worse when he bends forward.  No antibiotics in the past month.  No antipyretic in the past 6 hours. He has a past medical history of migraines, recurrent maxillary sinusitis, status post nasal septum and nasal sinus surgery, aortic atherosclerosis.  His last sinus infection was in January of this year.  He has not been using any nasal sprays.  PCP: Eva.  ENT: In [facility].  Past Medical History:  Diagnosis Date   Aortic atherosclerosis (HCC)    B12 deficiency    Benign neoplasm of cecum    Cervical radiculopathy    ED (erectile dysfunction)    GERD (gastroesophageal reflux disease)    Inguinal hernia, bilateral    Kidney stone    Migraine headache    1-2x/month   Nasal turbinate hypertrophy    Obesity (BMI 30-39.9)    Rotator cuff tendonitis, right    Umbilical hernia    Urolithiasis    Vitamin D  deficiency     Past Surgical History:  Procedure Laterality Date   CERVICAL DISC ARTHROPLASTY     C6/7   COLONOSCOPY WITH PROPOFOL  N/A 03/19/2022   Procedure: COLONOSCOPY WITH BIOPSY;  Surgeon: Jinny Carmine, MD;  Location: [facility];  Service: Endoscopy;  Laterality: N/A;   EXTRACORPOREAL SHOCK WAVE LITHOTRIPSY Left 09/27/2019   Procedure: EXTRACORPOREAL SHOCK WAVE LITHOTRIPSY (ESWL);  Surgeon: Francisca Redell BROCKS, MD;  Location: [facility];  Service: Urology;  Laterality: Left;   hemorrhiordectomy  2012   INSERTION  OF MESH Bilateral 05/10/2023   Procedure: INSERTION OF MESH;  Surgeon: Jordis Laneta FALCON, MD;  Location: [facility];  Service: General;  Laterality: Bilateral;   LITHOTRIPSY  2017   NASAL SEPTUM SURGERY  2014   NASAL SINUS SURGERY  2016   POLYPECTOMY N/A 03/19/2022   Procedure: POLYPECTOMY;  Surgeon: Jinny Carmine, MD;  Location: [facility];  Service: Endoscopy;  Laterality: N/A;   UMBILICAL HERNIA REPAIR N/A 05/10/2023   Procedure: HERNIA REPAIR UMBILICAL ADULT;  Surgeon: Jordis Laneta FALCON, MD;  Location: [facility];  Service: General;  Laterality: N/A;   URETEROSCOPY      Family History  Problem Relation Age of Onset   Diabetes Mother    Alcohol abuse Father    Arthritis Father    Hypertension Father    Emphysema Sister    Cancer Brother 71       prostate Cancer   Colon cancer Maternal Grandmother    Stroke Maternal Grandmother    Hyperlipidemia Maternal Grandfather     Social History   Tobacco Use   Smoking status: Never    Passive exposure: Never   Smokeless tobacco: Never  Vaping Use   Vaping status: Never Used  Substance Use Topics   Alcohol use: Yes    Comment: rare   Drug use: No    No current facility-administered medications for this encounter.  Current Outpatient Medications:  amoxicillin -clavulanate (AUGMENTIN ) 875-125 MG tablet, Take 1 tablet by mouth every 12 (twelve) hours., Disp: 14 tablet, Rfl: 0   fluticasone  (FLONASE ) 50 MCG/ACT nasal spray, Place 2 sprays into both nostrils daily., Disp: 16 g, Rfl: 0   ibuprofen  (ADVIL ) 600 MG tablet, Take 1 tablet (600 mg total) by mouth every 8 (eight) hours as needed., Disp: 30 tablet, Rfl: 0   predniSONE  (DELTASONE ) 20 MG tablet, Take 2 tablets (40 mg total) by mouth daily with breakfast for 5 days., Disp: 10 tablet, Rfl: 0   CALCIUM PO, Take 4 tablets by mouth as needed (Pt states he forgets to take this on a daily basis)., Disp: , Rfl:    cyanocobalamin  (VITAMIN B12) 1000 MCG/ML injection, INJECT 1 ML (1,000  MCG TOTAL) INTO THE MUSCLE ONCE A WEEK. FOR 2 WEEKS, Disp: 3 mL, Rfl: 1   omeprazole (PRILOSEC OTC) 20 MG tablet, Take 20 mg by mouth daily as needed (acid reflux)., Disp: , Rfl:    Syringe/Needle, Disp, (SYRINGE 3CC/25GX1) 25G X 1 3 ML MISC, Use for b12 injections, Disp: 50 each, Rfl: 0   tadalafil  (CIALIS ) 5 MG tablet, Take 1 tablet (5 mg total) by mouth daily as needed for erectile dysfunction. (Patient not taking: Reported on 10/19/2023), Disp: 90 tablet, Rfl: 3   tamsulosin  (FLOMAX ) 0.4 MG CAPS capsule, Take 1 capsule (0.4 mg total) by mouth daily., Disp: 30 capsule, Rfl: 3  No Known Allergies   ROS  As noted in HPI.   Physical Exam  BP (!) 134/97 (BP Location: Left Arm)   Pulse 69   Temp 98.2 F (36.8 C) (Oral)   Resp 17   SpO2 98%   Constitutional: Well developed, well nourished, no acute distress Eyes:  EOMI, conjunctiva normal bilaterally HENT: Normocephalic, atraumatic,mucus membranes moist.  Right ear: Decreased hearing.  External ear, EAC, TM normal.  No pain with traction on pinna, palpation of tragus or mastoid.  No TMJ tenderness, crepitus.  Left TM normal.  Purulent nasal drainage right side.  Erythematous, but not swollen turbinates.  Positive right maxillary sinus tenderness.  No frontal sinus tenderness.  No appreciable facial swelling. Neck: No cervical lymphadenopathy Respiratory: Normal inspiratory effort, lungs clear bilaterally Cardiovascular: Normal rate GI: nondistended skin: No rash, skin intact Musculoskeletal: no deformities Neurologic: Alert & oriented x 3, no focal neuro deficits Psychiatric: Speech and behavior appropriate   ED Course   Medications - No data to display  No orders of the defined types were placed in this encounter.   No results found for this or any previous visit (from the past 24 hours). No results found.  ED Clinical Impression  1. Acute non-recurrent maxillary sinusitis      ED Assessment/Plan     Patient  presents with acute sinusitis.  No evidence of otitis externa, otitis media.  He qualifies for antibiotics due to severe symptoms of upper dental pain.  Home with Augmentin , saline nasal irrigation, Flonase , Mucinex D, Tylenol /ibuprofen  prednisone  40 mg for 5 days as he states his sinus headaches is triggering off his migraines.  Follow-up with his PCP as needed.  Discussed MDM, treatment plan, and plan for follow-up with patient.. patient agrees with plan.   Meds ordered this encounter  Medications   fluticasone  (FLONASE ) 50 MCG/ACT nasal spray    Sig: Place 2 sprays into both nostrils daily.    Dispense:  16 g    Refill:  0   ibuprofen  (ADVIL ) 600 MG tablet    Sig: Take  1 tablet (600 mg total) by mouth every 8 (eight) hours as needed.    Dispense:  30 tablet    Refill:  0   predniSONE  (DELTASONE ) 20 MG tablet    Sig: Take 2 tablets (40 mg total) by mouth daily with breakfast for 5 days.    Dispense:  10 tablet    Refill:  0   amoxicillin -clavulanate (AUGMENTIN ) 875-125 MG tablet    Sig: Take 1 tablet by mouth every 12 (twelve) hours.    Dispense:  14 tablet    Refill:  0      *This clinic note was created using Scientist, clinical (histocompatibility and immunogenetics). Therefore, there may be occasional mistakes despite careful proofreading.  ?    Van Knee, MD 12/23/23 1143

## 2023-12-20 NOTE — Discharge Instructions (Signed)
 Start Mucinex-D to keep the mucous thin and to decongest you.   You may take 600 mg of motrin  with 1000 mg of tylenol  up to 3-4 times a day as needed for pain. This is an effective combination for pain.  Most sinus infections are viral and do not need antibiotics unless you have a high fever, have had this for 10 days, or you get better and then get sick again.  If you want to wait to start the antibiotics, that is fine.  It is also okay to start them today.  Use a NeilMed sinus rinse with distilled water  as often as you want to to reduce nasal congestion. Follow the directions on the box.  Flonase , prednisone  for swelling and pain.  Go to www.goodrx.com to look up your medications. This will give you a list of where you can find your prescriptions at the most affordable prices. Or you can ask the pharmacist what the cash price is. This is frequently cheaper than going through insurance.

## 2024-01-12 ENCOUNTER — Other Ambulatory Visit: Payer: Self-pay | Admitting: Internal Medicine

## 2024-02-02 ENCOUNTER — Other Ambulatory Visit: Payer: Self-pay | Admitting: Internal Medicine

## 2024-02-17 ENCOUNTER — Other Ambulatory Visit: Payer: Self-pay | Admitting: Internal Medicine

## 2024-03-13 ENCOUNTER — Ambulatory Visit
Admission: RE | Admit: 2024-03-13 | Discharge: 2024-03-13 | Disposition: A | Discharge status: Home / Self Care | Source: Ambulatory Visit | Attending: Internal Medicine | Admitting: Internal Medicine

## 2024-03-13 DIAGNOSIS — Z87442 Personal history of urinary calculi: Secondary | ICD-10-CM | POA: Insufficient documentation

## 2024-04-10 NOTE — Telephone Encounter (Signed)
 open in error

## 2024-04-11 ENCOUNTER — Ambulatory Visit: Admitting: Internal Medicine

## 2024-04-11 ENCOUNTER — Encounter: Payer: Self-pay | Admitting: Internal Medicine

## 2024-04-11 VITALS — BP 120/78 | HR 80 | Ht 73.0 in | Wt 251.8 lb

## 2024-04-11 DIAGNOSIS — Z87442 Personal history of urinary calculi: Secondary | ICD-10-CM

## 2024-04-11 DIAGNOSIS — J029 Acute pharyngitis, unspecified: Secondary | ICD-10-CM | POA: Diagnosis not present

## 2024-04-11 DIAGNOSIS — R635 Abnormal weight gain: Secondary | ICD-10-CM | POA: Diagnosis not present

## 2024-04-11 DIAGNOSIS — Z113 Encounter for screening for infections with a predominantly sexual mode of transmission: Secondary | ICD-10-CM

## 2024-04-11 DIAGNOSIS — J0110 Acute frontal sinusitis, unspecified: Secondary | ICD-10-CM | POA: Diagnosis not present

## 2024-04-11 DIAGNOSIS — K2100 Gastro-esophageal reflux disease with esophagitis, without bleeding: Secondary | ICD-10-CM

## 2024-04-11 DIAGNOSIS — E538 Deficiency of other specified B group vitamins: Secondary | ICD-10-CM | POA: Diagnosis not present

## 2024-04-11 DIAGNOSIS — E785 Hyperlipidemia, unspecified: Secondary | ICD-10-CM

## 2024-04-11 DIAGNOSIS — Z0000 Encounter for general adult medical examination without abnormal findings: Secondary | ICD-10-CM | POA: Diagnosis not present

## 2024-04-11 DIAGNOSIS — I700 Atherosclerosis of aorta: Secondary | ICD-10-CM

## 2024-04-11 DIAGNOSIS — M50123 Cervical disc disorder at C6-C7 level with radiculopathy: Secondary | ICD-10-CM

## 2024-04-11 LAB — COMPREHENSIVE METABOLIC PANEL WITH GFR
ALT: 36 U/L (ref 0–53)
AST: 24 U/L (ref 0–37)
Albumin: 4.4 g/dL (ref 3.5–5.2)
Alkaline Phosphatase: 78 U/L (ref 39–117)
BUN: 13 mg/dL (ref 6–23)
CO2: 29 meq/L (ref 19–32)
Calcium: 9.1 mg/dL (ref 8.4–10.5)
Chloride: 104 meq/L (ref 96–112)
Creatinine, Ser: 0.98 mg/dL (ref 0.40–1.50)
GFR: 88.23 mL/min (ref >60.00)
Glucose, Bld: 92 mg/dL (ref 70–99)
Potassium: 4.3 meq/L (ref 3.5–5.1)
Sodium: 139 meq/L (ref 135–145)
Total Bilirubin: 1.5 mg/dL — ABNORMAL HIGH (ref 0.2–1.2)
Total Protein: 7.6 g/dL (ref 6.0–8.3)

## 2024-04-11 LAB — B12 AND FOLATE PANEL
Folate: 14.8 ng/mL (ref >5.9)
Vitamin B-12: 740 pg/mL (ref 211–911)

## 2024-04-11 LAB — LIPID PANEL
Cholesterol: 159 mg/dL (ref 0–200)
HDL: 43.8 mg/dL (ref >39.00)
LDL Cholesterol: 100 mg/dL — ABNORMAL HIGH (ref 0–99)
NonHDL: 115.17
Total CHOL/HDL Ratio: 4
Triglycerides: 75 mg/dL (ref 0.0–149.0)
VLDL: 15 mg/dL (ref 0.0–40.0)

## 2024-04-11 LAB — POCT INFLUENZA A/B
Influenza A, POC: NEGATIVE
Influenza B, POC: NEGATIVE

## 2024-04-11 LAB — POC COVID19 BINAXNOW: SARS Coronavirus 2 Ag: NEGATIVE

## 2024-04-11 LAB — HEMOGLOBIN A1C: Hgb A1c MFr Bld: 5.2 % (ref 4.6–6.5)

## 2024-04-11 LAB — POCT RAPID STREP A (OFFICE): Rapid Strep A Screen: NEGATIVE

## 2024-04-11 LAB — LDL CHOLESTEROL, DIRECT: Direct LDL: 107 mg/dL

## 2024-04-11 LAB — TSH: TSH: 1.45 u[IU]/mL (ref 0.35–5.50)

## 2024-04-11 MED ORDER — AMOXICILLIN-POT CLAVULANATE 875-125 MG PO TABS: 1.0000 | ORAL_TABLET | Freq: Two times a day (BID) | ORAL | 0 refills | Status: AC

## 2024-04-11 MED ORDER — ROSUVASTATIN CALCIUM 10 MG PO TABS: 10.0000 mg | ORAL_TABLET | Freq: Every day | ORAL | 0 refills | Status: AC

## 2024-04-11 MED ORDER — CYANOCOBALAMIN 1000 MCG/ML IJ SOLN: 1000.0000 ug | INTRAMUSCULAR | 3 refills | Status: DC

## 2024-04-11 NOTE — Progress Notes (Unsigned)
 Patient ID: Seth Stone, male    DOB: 1970/10/31  Age: 53 y.o. MRN: [id number]  The patient is here for annual preventive examination and management of other chronic and acute problems.   The risk factors are reflected in the social history.   The roster of all physicians providing medical care to patient - is listed in the Snapshot section of the chart.   Activities of daily living:  The patient is 100% independent in all ADLs: dressing, toileting, feeding as well as independent mobility   Home safety : The patient has smoke detectors in the home. They wear seatbelts.  There are no unsecured firearms at home. There is no violence in the home.    There is no risks for hepatitis, STDs or HIV. There is no   history of blood transfusion. They have no travel history to infectious disease endemic areas of the world.   The patient has seen their dentist in the last six month. They have seen their eye doctor in the last year. The patinet  denies slight hearing difficulty with regard to whispered voices and some television programs.  They have deferred audiologic testing in the last year.  They do not  have excessive sun exposure. Discussed the need for sun protection: hats, long sleeves and use of sunscreen if there is significant sun exposure.    Diet: the importance of a healthy diet is discussed. They do have a healthy diet.   The benefits of regular aerobic exercise were discussed. The patient  exercises  vigorously 3 to days per week  for  60 minutes without chest pain or claudication.    Depression screen: there are no signs or vegative symptoms of depression- irritability, change in appetite, anhedonia, sadness/tearfullness.   The following portions of the patient's history were reviewed and updated as appropriate: allergies, current medications, past family history, past medical history,  past surgical history, past social history  and problem list.   Visual acuity was not assessed per  patient preference since the patient has regular follow up with an  ophthalmologist. Hearing and body mass index were assessed and reviewed.    During the course of the visit the patient was educated and counseled about appropriate screening and preventive services including : fall prevention , diabetes screening, nutrition counseling, colorectal cancer screening, and recommended immunizations.    Chief Complaint:  Body aches, chills  and sore throat, right  cervical lymphadenopathy,  and right ear pain for the last  2 days  after a cold  last week that included sinus congestion and rhinorrhea.    Weight gain over the last year.  Frustrated. Diet is clean.  Not working out as much as last year     Review of Symptoms  Patient denies , unintentional weight loss, skin rash, eye pain, s dysphagia,  hemoptysis , cough, dyspnea, wheezing, chest pain, palpitations, orthopnea, edema, abdominal pain, nausea, melena, diarrhea, constipation, flank pain, dysuria, hematuria, urinary  Frequency, nocturia, numbness, tingling, seizures,  Focal weakness, Loss of consciousness,  Tremor, insomnia, depression, anxiety, and suicidal ideation.    Physical Exam:  BP 120/78   Pulse 80   Ht 6' 1 (1.854 m)   Wt 251 lb 12.8 oz (114.2 kg)   SpO2 97%   BMI 33.22 kg/m    Physical Exam Vitals reviewed.  Constitutional:      General: He is not in acute distress.    Appearance: Normal appearance. He is normal weight. He is not  ill-appearing, toxic-appearing or diaphoretic.  HENT:     Head: Normocephalic and atraumatic.     Right Ear: Tympanic membrane, ear canal and external ear normal. There is no impacted cerumen.     Left Ear: Tympanic membrane, ear canal and external ear normal. There is no impacted cerumen.     Nose: Congestion and rhinorrhea present.     Mouth/Throat:     Mouth: Mucous membranes are moist.     Pharynx: Oropharynx is clear.  Eyes:     General: No scleral icterus.       Right eye: No  discharge.        Left eye: No discharge.     Extraocular Movements: Extraocular movements intact.     Conjunctiva/sclera: Conjunctivae normal.     Pupils: Pupils are equal, round, and reactive to light.  Neck:     Thyroid : No thyromegaly.     Vascular: No carotid bruit or JVD.  Cardiovascular:     Rate and Rhythm: Normal rate and regular rhythm.     Pulses: Normal pulses.     Heart sounds: Normal heart sounds.  Pulmonary:     Effort: Pulmonary effort is normal. No respiratory distress.     Breath sounds: Normal breath sounds.  Abdominal:     General: Bowel sounds are normal.     Palpations: Abdomen is soft. There is no mass.     Tenderness: There is no abdominal tenderness. There is no guarding or rebound.  Musculoskeletal:        General: Normal range of motion.     Cervical back: Normal range of motion and neck supple.  Lymphadenopathy:     Cervical: No cervical adenopathy.  Skin:    General: Skin is warm and dry.  Neurological:     General: No focal deficit present.     Mental Status: He is alert and oriented to person, place, and time. Mental status is at baseline.  Psychiatric:        Mood and Affect: Mood normal.        Behavior: Behavior normal.        Thought Content: Thought content normal.        Judgment: Judgment normal.     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  Specifically,  he has normal bleeod pressure  and pulse.  age appropriate education and counseling updated, referrals for preventative services and immunizations addressed, dietary and smoking counseling addressed, most recent labs reviewed.  I have personally reviewed and have noted:   1) the patient's medical and social history 2) The pt's use of alcohol, tobacco, and illicit drugs 3) The patient's current medications and supplements 4) Functional ability including  ADL's, fall risk, home safety risk, hearing and visual impairment 5) Diet and physical activities 6) Evidence for depression or mood disorder 7) The patient's height, weight, and BMI have been recorded in the chart       Hyperlipidemia LDL goal <100 -     Lipid panel -     LDL cholesterol, direct -     Comprehensive metabolic panel with GFR -     Comprehensive metabolic panel with GFR; Future -     CK; Future  History of nephrolithiasis Assessment & Plan: THERE is no evidence of renal or ureteral stones  in either kidney by   repeat CT of abdomen and  pelvis done  October 2025    Sore throat -     POC COVID-19 BinaxNow -     POCT rapid strep A -     POCT Influenza A/B  Acute non-recurrent frontal sinusitis Assessment & Plan: Right side,  secondary to recent viral URI resulting in sinus congestion.  COVID  FLU. STREP POC tests negative.  Rx augmentin  x 7 days , Afrin x 5 days .  Daily use of a probiotic advised for 3 weeks.     B12 deficiency Assessment & Plan: Resolved with supplementation . Continue current regimen.  Lab Results  Component Value Date   VITAMINB12 740 04/11/2024     Orders: -     B12 and Folate Panel  Weight gain -     TSH -     Hemoglobin A1c  Screen for STD (sexually transmitted disease) -     HIV Antibody (routine testing w rflx) -     Hepatitis C antibody  Aortic atherosclerosis Assessment & Plan:  CAC score was ZERO IN 2023 AND 10 yr risk is 4% by current panel. However he remains concerned and is willing to restart therapy  .  Crestor 10 mg prescribed . Baseline lipids today; return after 3-4 weeks of therapy for labs ONLY    Gastroesophageal reflux disease with esophagitis without hemorrhage Assessment & Plan: Symptoms are controlled with OTC PPI used prn    Cervical disc disorder at C6-C7 level with radiculopathy Assessment & Plan: Symptoms of pain have resolved S/p C6-7 arthropolasty in February 2020. He has maintained excellent  cervical spine ROM and upper extremity reflexes, sensation and strength .     Other orders -     Amoxicillin -Pot Clavulanate; Take 1 tablet by mouth every 12 (twelve) hours.  Dispense: 14 tablet; Refill: 0 -     Cyanocobalamin ; Inject 1 mL (1,000 mcg total) into the muscle every 30 (thirty) days.  Dispense: 3 mL; Refill: 3 -     Rosuvastatin Calcium; Take 1 tablet (10 mg total) by mouth daily.  Dispense: 90 tablet; Refill: 0    No follow-ups on file.  Verneita LITTIE Kettering, MD

## 2024-04-11 NOTE — Assessment & Plan Note (Addendum)
 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  Specifically,  he has normal bleeod pressure  and pulse.  age appropriate education and counseling updated, referrals for preventative services and immunizations addressed, dietary and smoking counseling addressed, most recent labs reviewed.  I have personally reviewed and have noted:   1) the patient's medical and social history 2) The pt's use of alcohol, tobacco, and illicit drugs 3) The patient's current medications and supplements 4) Functional ability including ADL's, fall risk, home safety risk, hearing and visual impairment 5) Diet and physical activities 6) Evidence for depression or mood disorder 7) The patient's height, weight, and BMI have been recorded in the chart

## 2024-04-11 NOTE — Assessment & Plan Note (Signed)
 THERE is no evidence of renal or ureteral stones  in either kidney by   repeat CT of abdomen and pelvis done  October 2025

## 2024-04-11 NOTE — Assessment & Plan Note (Signed)
 Right side,  secondary to recent viral URI resulting in sinus congestion.  COVID  FLU. STREP POC tests negative.  Rx augmentin  x 7 days , Afrin x 5 days .  Daily use of a probiotic advised for 3 weeks.

## 2024-04-11 NOTE — Patient Instructions (Addendum)
 I am treating you for sinusitis/otitis which is a complication from your viral infection due to  persistent sinus congestion.   I am prescribing an antibiotic (augmentin  ) take with food twice daily    To manage the infection and the inflammation in your ear/sinuses.   Consider adding Afrin  every 12 hours for a few days for congestion   For the daytime cough:  guaifenesin and dextromethorphan combination (available in pill or liquid form as mucinex DM)  Please take  (or continue)  a probiotic ( Align, Floraque or Culturelle) or eat a serving of Activia every day for 3 weeks  to prevent  the  serious antibiotic associated diarrhea  Called clostridium dificile colitis     Please start the rosuvastatin when you can return after 3-4 weeks for surveillance labs (liver, muscle enzymes)

## 2024-04-11 NOTE — Assessment & Plan Note (Signed)
 CAC score was ZERO IN 2023 AND 10 yr risk is 4% by current panel. However he remains concerned and is willing to restart therapy  .  Crestor  10 mg prescribed . Baseline lipids today; return after 3-4 weeks of therapy for labs ONLY

## 2024-04-12 LAB — HIV ANTIBODY (ROUTINE TESTING W REFLEX)
HIV 1&2 Ab, 4th Generation: NONREACTIVE
HIV FINAL INTERPRETATION: NEGATIVE

## 2024-04-12 LAB — HEPATITIS C ANTIBODY: Hepatitis C Ab: NONREACTIVE

## 2024-04-12 NOTE — Assessment & Plan Note (Signed)
 Symptoms are controlled with OTC PPI used prn

## 2024-04-12 NOTE — Assessment & Plan Note (Signed)
 Symptoms of pain have resolved S/p C6-7 arthropolasty in February 2020. He has maintained excellent cervical spine ROM and upper extremity reflexes, sensation and strength .

## 2024-04-12 NOTE — Assessment & Plan Note (Signed)
 Resolved with supplementation . Continue current regimen.  Lab Results  Component Value Date   VITAMINB12 740 04/11/2024

## 2024-04-14 ENCOUNTER — Ambulatory Visit: Payer: Self-pay | Admitting: Internal Medicine

## 2024-06-03 ENCOUNTER — Other Ambulatory Visit: Payer: Self-pay | Admitting: Internal Medicine

## 2025-04-12 ENCOUNTER — Encounter: Admitting: Internal Medicine
